# Patient Record
Sex: Female | Born: 1960 | Race: White | Hispanic: No | Smoking: Current every day smoker
Health system: Southern US, Community
[De-identification: ages and names within clinical notes are randomized; demographics above are authoritative.]

## PROBLEM LIST (undated history)

## (undated) DIAGNOSIS — F419 Anxiety disorder, unspecified: Secondary | ICD-10-CM

## (undated) DIAGNOSIS — F32A Depression, unspecified: Secondary | ICD-10-CM

## (undated) DIAGNOSIS — E079 Disorder of thyroid, unspecified: Secondary | ICD-10-CM

## (undated) DIAGNOSIS — F329 Major depressive disorder, single episode, unspecified: Secondary | ICD-10-CM

## (undated) DIAGNOSIS — I10 Essential (primary) hypertension: Secondary | ICD-10-CM

## (undated) DIAGNOSIS — M5430 Sciatica, unspecified side: Secondary | ICD-10-CM

## (undated) HISTORY — DX: Essential (primary) hypertension: I10

## (undated) HISTORY — PX: ABDOMINAL HYSTERECTOMY: SHX81

## (undated) HISTORY — DX: Anxiety disorder, unspecified: F41.9

---

## 2009-02-10 ENCOUNTER — Emergency Department (HOSPITAL_COMMUNITY): Admission: EM | Admit: 2009-02-10 | Discharge: 2009-02-10 | Payer: Self-pay | Admitting: Emergency Medicine

## 2009-03-29 ENCOUNTER — Ambulatory Visit (HOSPITAL_COMMUNITY): Admission: RE | Admit: 2009-03-29 | Discharge: 2009-03-29 | Payer: Self-pay | Admitting: Family Medicine

## 2010-04-04 ENCOUNTER — Ambulatory Visit (HOSPITAL_COMMUNITY): Admission: RE | Admit: 2010-04-04 | Discharge: 2010-04-04 | Payer: Self-pay | Admitting: Family Medicine

## 2010-08-27 IMAGING — CR DG FOOT COMPLETE 3+V*L*
3 series · 3 of 3 positions shown · non-contrast
Comparison: None

CLINICAL DATA: Left foot pain, swelling, fall

LEFT FOOT - COMPLETE 3+ VIEW

[view not recorded (1 of 3)]
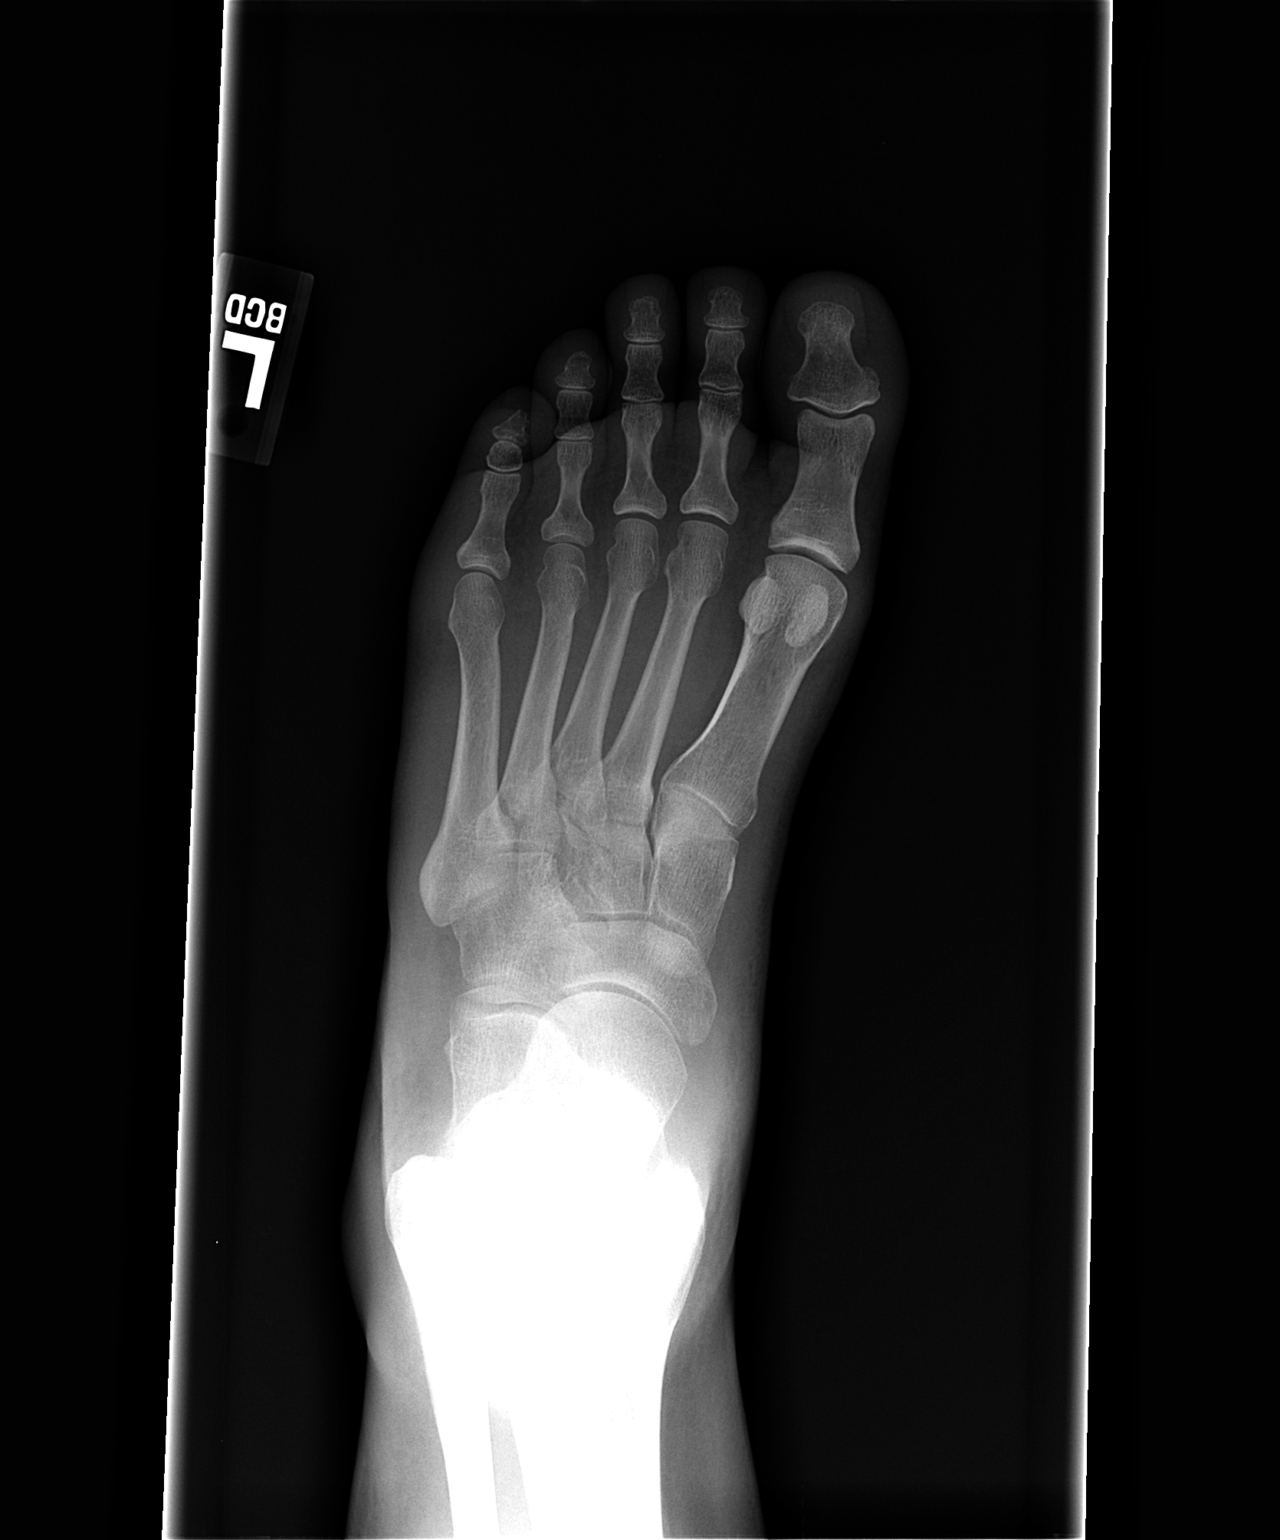

[view not recorded (2 of 3)]
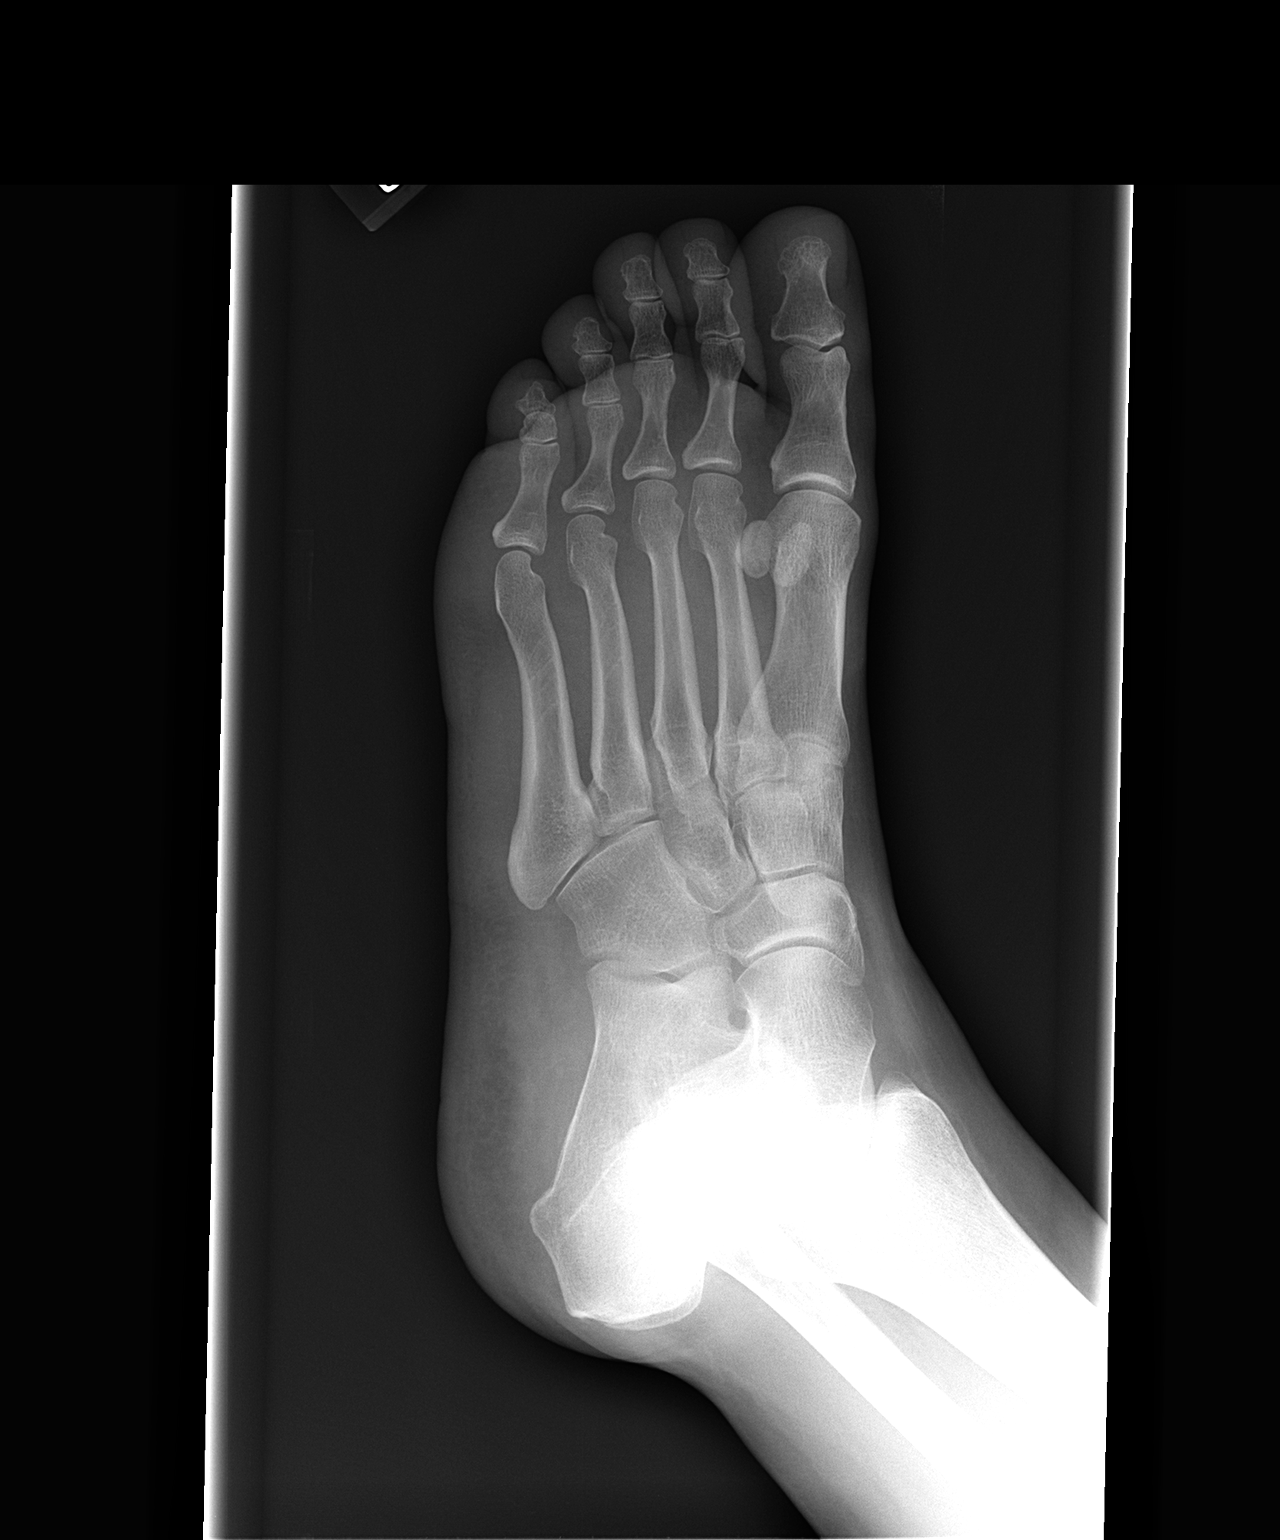

[view not recorded (3 of 3)]
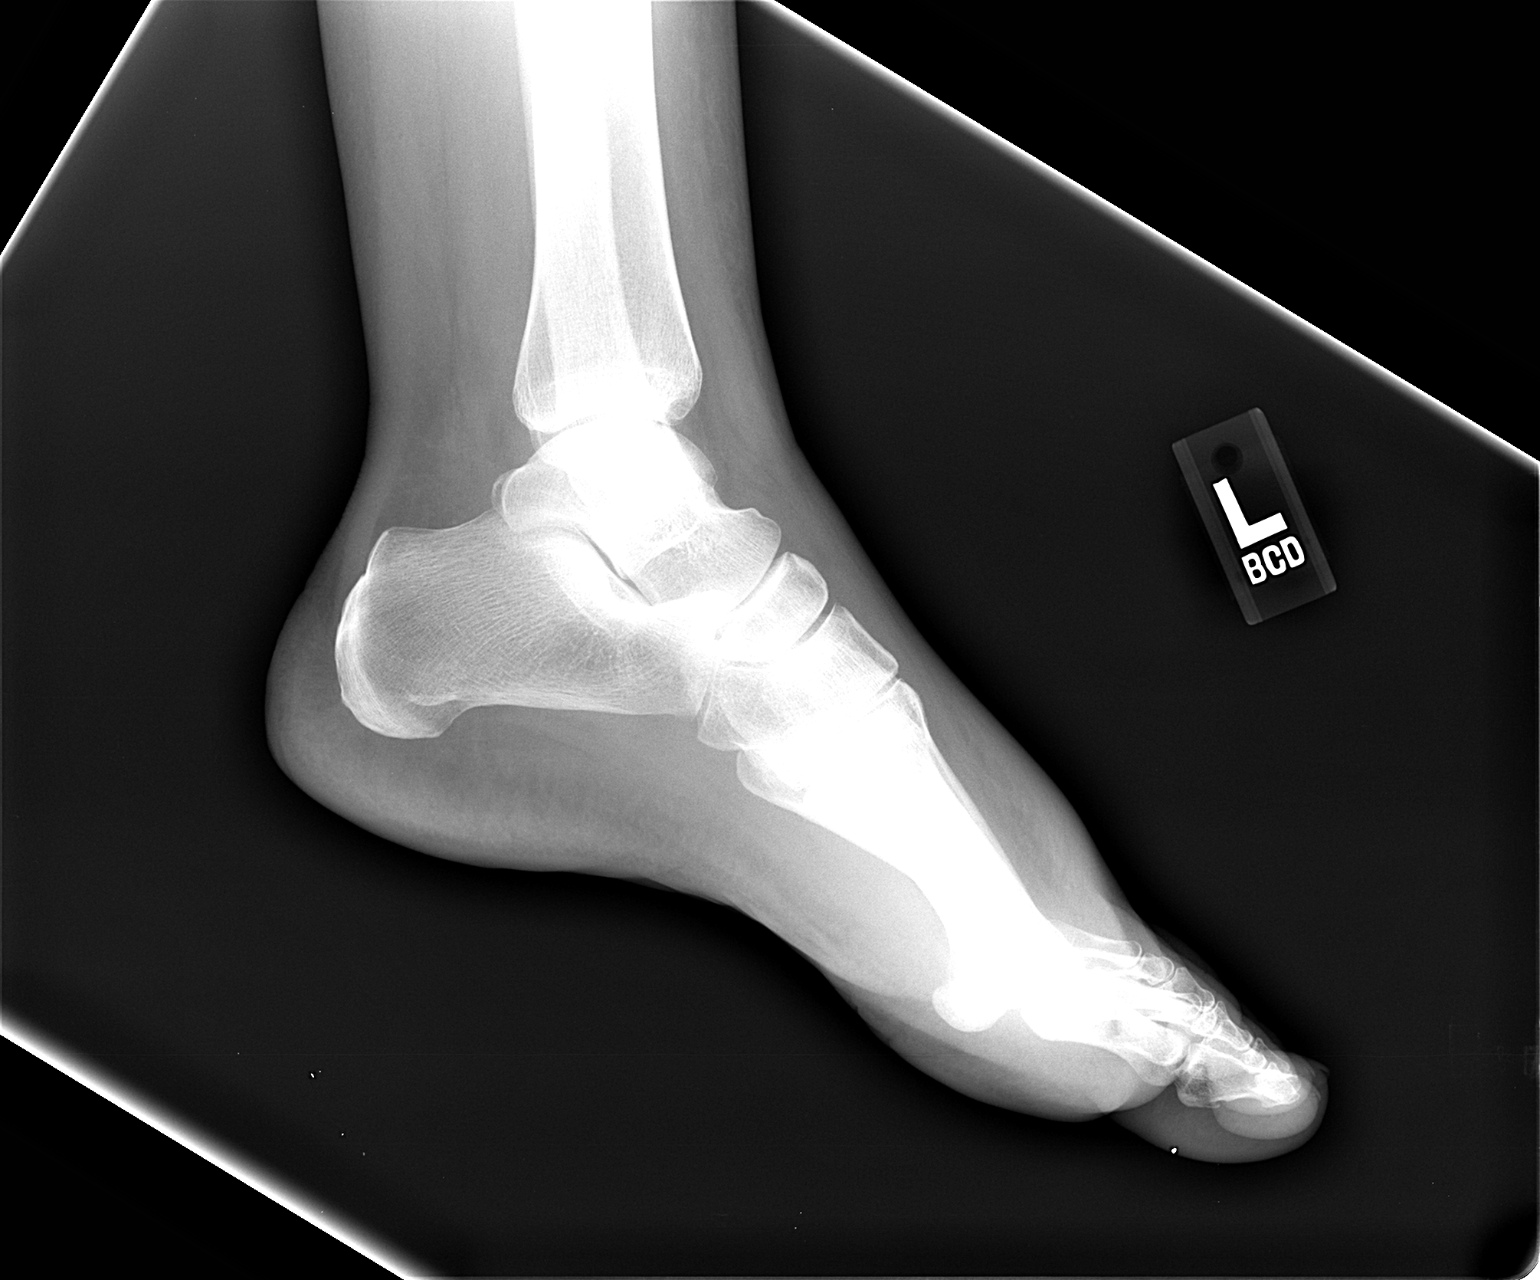

[3 of 3 positions shown; findings below may reference images not displayed]

FINDINGS: Dorsal soft tissue swelling distal foot.
Bone mineralization normal.
Joint spaces preserved.
No acute fracture, dislocation, or bone destruction.
IMPRESSION: No acute bony abnormalities.

## 2011-03-29 ENCOUNTER — Other Ambulatory Visit (HOSPITAL_COMMUNITY): Payer: Self-pay | Admitting: Family Medicine

## 2011-03-29 DIAGNOSIS — Z139 Encounter for screening, unspecified: Secondary | ICD-10-CM

## 2011-04-10 ENCOUNTER — Ambulatory Visit (HOSPITAL_COMMUNITY)
Admission: RE | Admit: 2011-04-10 | Discharge: 2011-04-10 | Disposition: A | Discharge status: Home / Self Care | Payer: PRIVATE HEALTH INSURANCE | Source: Ambulatory Visit | Attending: Family Medicine | Admitting: Family Medicine

## 2011-04-10 DIAGNOSIS — Z139 Encounter for screening, unspecified: Secondary | ICD-10-CM

## 2011-04-10 DIAGNOSIS — Z1231 Encounter for screening mammogram for malignant neoplasm of breast: Secondary | ICD-10-CM | POA: Insufficient documentation

## 2013-03-14 ENCOUNTER — Emergency Department (HOSPITAL_COMMUNITY)
Admission: EM | Admit: 2013-03-14 | Discharge: 2013-03-14 | Disposition: A | Discharge status: Home / Self Care | Payer: Disability Insurance | Attending: Emergency Medicine | Admitting: Emergency Medicine

## 2013-03-14 ENCOUNTER — Encounter (HOSPITAL_COMMUNITY): Payer: Self-pay | Admitting: Emergency Medicine

## 2013-03-14 DIAGNOSIS — M5430 Sciatica, unspecified side: Secondary | ICD-10-CM | POA: Insufficient documentation

## 2013-03-14 DIAGNOSIS — F17200 Nicotine dependence, unspecified, uncomplicated: Secondary | ICD-10-CM | POA: Insufficient documentation

## 2013-03-14 DIAGNOSIS — Z8659 Personal history of other mental and behavioral disorders: Secondary | ICD-10-CM | POA: Insufficient documentation

## 2013-03-14 DIAGNOSIS — M5432 Sciatica, left side: Secondary | ICD-10-CM

## 2013-03-14 DIAGNOSIS — Z8639 Personal history of other endocrine, nutritional and metabolic disease: Secondary | ICD-10-CM | POA: Insufficient documentation

## 2013-03-14 DIAGNOSIS — Z862 Personal history of diseases of the blood and blood-forming organs and certain disorders involving the immune mechanism: Secondary | ICD-10-CM | POA: Insufficient documentation

## 2013-03-14 HISTORY — DX: Depression, unspecified: F32.A

## 2013-03-14 HISTORY — DX: Disorder of thyroid, unspecified: E07.9

## 2013-03-14 HISTORY — DX: Major depressive disorder, single episode, unspecified: F32.9

## 2013-03-14 HISTORY — DX: Sciatica, unspecified side: M54.30

## 2013-03-14 MED ORDER — PROMETHAZINE HCL 12.5 MG PO TABS: 12.5000 mg | ORAL_TABLET | Freq: Four times a day (QID) | ORAL | Status: DC | PRN

## 2013-03-14 MED ORDER — NAPROXEN 500 MG PO TABS: 500.0000 mg | ORAL_TABLET | Freq: Two times a day (BID) | ORAL | Status: DC

## 2013-03-14 MED ORDER — CYCLOBENZAPRINE HCL 10 MG PO TABS: 10.0000 mg | ORAL_TABLET | Freq: Two times a day (BID) | ORAL | Status: DC | PRN

## 2013-03-14 NOTE — ED Provider Notes (Signed)
CSN: [id number]     Arrival date & time 03/14/13  1308 History   First MD Initiated Contact with Patient 03/14/13 1340     Chief Complaint  Patient presents with  . Back Pain   (Consider location/radiation/quality/duration/timing/severity/associated sxs/prior Treatment) Patient is a 52 y.o. female presenting with back pain. The history is provided by the patient.  Back Pain Location:  Lumbar spine Radiates to:  L posterior upper leg Pain severity:  Severe Pain is:  Same all the time Onset quality:  Gradual Duration:  3 days Timing:  Constant Progression:  Worsening Chronicity:  Chronic Relieved by:  Nothing Worsened by:  Movement, twisting, standing and bending Ineffective treatments:  Ibuprofen, cold packs and heating pad Associated symptoms: no bladder incontinence, no bowel incontinence, no chest pain, no dysuria, no fever, no numbness and no weakness   Risk factors: menopause   Risk factors: no hx of cancer, no hx of osteoporosis and no steroid use    Sarah Mercer is a 52 y.o. female who presents to the ED with low back pain that started 3 days ago. She has been diagnosed in the past with DDD, sciatica and bulging disc. That was years ago and has had problems off and on but just takes ibuprofen and alternates heat and ice until it goes away. This time the pain will not go away.   Past Medical History  Diagnosis Date  . Sciatica   . Thyroid disease   . Depression    Past Surgical History  Procedure Laterality Date  . Abdominal hysterectomy     No family history on file. History  Substance Use Topics  . Smoking status: Current Every Day Smoker    Types: Cigarettes  . Smokeless tobacco: Not on file  . Alcohol Use: Yes     Comment: Occ   OB History   Grav Para Term Preterm Abortions TAB SAB Ect Mult Living                 Review of Systems  Constitutional: Negative for fever.  Respiratory: Negative for shortness of breath.   Cardiovascular: Negative for chest  pain.  Gastrointestinal: Negative for bowel incontinence.  Genitourinary: Negative for bladder incontinence, dysuria, urgency and frequency.  Musculoskeletal: Positive for back pain.  Skin: Negative for wound.  Neurological: Negative for weakness and numbness.  Psychiatric/Behavioral: The patient is not nervous/anxious.     Allergies  Review of patient's allergies indicates no known allergies.  Home Medications  No current outpatient prescriptions on file. BP 130/71  Pulse 73  Temp(Src) 98.1 F (36.7 C)  Resp 18  Ht 5\' 2"  (1.575 m)  Wt 148 lb (67.132 kg)  BMI 27.06 kg/m2  SpO2 99% Physical Exam  Nursing note and vitals reviewed. Constitutional: She is oriented to person, place, and time. She appears well-developed and well-nourished. No distress.  HENT:  Head: Normocephalic and atraumatic.  Eyes: Conjunctivae and EOM are normal.  Neck: Neck supple.  Cardiovascular: Normal rate, regular rhythm and normal heart sounds.   Pulmonary/Chest: Effort normal.  Abdominal: Soft. There is no tenderness.  Musculoskeletal:       Lumbar back: She exhibits decreased range of motion, tenderness and spasm. She exhibits no deformity.       Back:  Pedal pulses equal bilateral, adequate circulation, good touch sensation. Pain with straight leg raises on the left.   Neurological: She is alert and oriented to person, place, and time. She has normal strength and normal reflexes. No  cranial nerve deficit or sensory deficit. Coordination and gait normal.  Skin: Skin is warm and dry.  Psychiatric: She has a normal mood and affect. Her behavior is normal.    ED Course  Procedures   MDM  52 y.o. female with sciatica left. Will treat pain and she will follow up with her PCP. She is stable for discharge and remains neurovascularly intact.  Discussed with the patient and all questioned fully answered. She will returnif any problems arise.    Medication List    TAKE these medications        cyclobenzaprine 10 MG tablet  Commonly known as:  FLEXERIL  Take 1 tablet (10 mg total) by mouth 2 (two) times daily as needed for muscle spasms.     naproxen 500 MG tablet  Commonly known as:  NAPROSYN  Take 1 tablet (500 mg total) by mouth 2 (two) times daily.     promethazine 12.5 MG tablet  Commonly known as:  PHENERGAN  Take 1 tablet (12.5 mg total) by mouth every 6 (six) hours as needed for nausea.      ASK your doctor about these medications       levothyroxine 75 MCG tablet  Commonly known as:  SYNTHROID, LEVOTHROID  Take 75 mcg by mouth daily before breakfast.           Janne Napoleon, NP 03/14/13 1828

## 2013-03-14 NOTE — ED Notes (Signed)
Lower back pain with pain radiating down left leg. Pt states hx of sciatica but states pain became severe Wednesday night.

## 2013-03-15 NOTE — ED Provider Notes (Signed)
Medical screening examination/treatment/procedure(s) were performed by non-physician practitioner and as supervising physician I was immediately available for consultation/collaboration.   Dagmar Hait, MD 03/15/13 [phone]

## 2013-05-19 ENCOUNTER — Other Ambulatory Visit (HOSPITAL_COMMUNITY): Payer: Self-pay | Admitting: *Deleted

## 2013-05-19 DIAGNOSIS — Z139 Encounter for screening, unspecified: Secondary | ICD-10-CM

## 2013-05-25 ENCOUNTER — Ambulatory Visit (HOSPITAL_COMMUNITY)
Admission: RE | Admit: 2013-05-25 | Discharge: 2013-05-25 | Disposition: A | Discharge status: Home / Self Care | Payer: PRIVATE HEALTH INSURANCE | Source: Ambulatory Visit | Attending: *Deleted | Admitting: *Deleted

## 2013-05-25 DIAGNOSIS — Z139 Encounter for screening, unspecified: Secondary | ICD-10-CM

## 2013-05-25 DIAGNOSIS — Z1231 Encounter for screening mammogram for malignant neoplasm of breast: Secondary | ICD-10-CM | POA: Insufficient documentation

## 2013-05-26 ENCOUNTER — Other Ambulatory Visit (HOSPITAL_COMMUNITY): Payer: Self-pay | Admitting: *Deleted

## 2013-05-26 DIAGNOSIS — IMO0002 Reserved for concepts with insufficient information to code with codable children: Secondary | ICD-10-CM

## 2013-06-10 ENCOUNTER — Ambulatory Visit (HOSPITAL_COMMUNITY)
Admission: RE | Admit: 2013-06-10 | Discharge: 2013-06-10 | Disposition: A | Discharge status: Home / Self Care | Payer: PRIVATE HEALTH INSURANCE | Source: Ambulatory Visit | Attending: *Deleted | Admitting: *Deleted

## 2013-06-10 ENCOUNTER — Other Ambulatory Visit (HOSPITAL_COMMUNITY): Payer: Self-pay | Admitting: *Deleted

## 2013-06-10 DIAGNOSIS — R229 Localized swelling, mass and lump, unspecified: Secondary | ICD-10-CM

## 2013-06-10 DIAGNOSIS — IMO0002 Reserved for concepts with insufficient information to code with codable children: Secondary | ICD-10-CM

## 2013-06-10 DIAGNOSIS — N6009 Solitary cyst of unspecified breast: Secondary | ICD-10-CM | POA: Insufficient documentation

## 2013-06-10 DIAGNOSIS — N6320 Unspecified lump in the left breast, unspecified quadrant: Secondary | ICD-10-CM

## 2013-06-10 DIAGNOSIS — N63 Unspecified lump in unspecified breast: Secondary | ICD-10-CM | POA: Insufficient documentation

## 2013-06-10 DIAGNOSIS — R928 Other abnormal and inconclusive findings on diagnostic imaging of breast: Secondary | ICD-10-CM | POA: Insufficient documentation

## 2013-06-15 ENCOUNTER — Other Ambulatory Visit (HOSPITAL_COMMUNITY): Payer: Self-pay | Admitting: Nurse Practitioner

## 2013-06-15 DIAGNOSIS — N6320 Unspecified lump in the left breast, unspecified quadrant: Secondary | ICD-10-CM

## 2013-06-17 ENCOUNTER — Other Ambulatory Visit (HOSPITAL_COMMUNITY): Payer: Self-pay | Admitting: Nurse Practitioner

## 2013-06-17 ENCOUNTER — Encounter (HOSPITAL_COMMUNITY): Payer: Self-pay

## 2013-06-17 ENCOUNTER — Ambulatory Visit (HOSPITAL_COMMUNITY)
Admission: RE | Admit: 2013-06-17 | Discharge: 2013-06-17 | Disposition: A | Discharge status: Home / Self Care | Payer: PRIVATE HEALTH INSURANCE | Source: Ambulatory Visit | Attending: *Deleted | Admitting: *Deleted

## 2013-06-17 ENCOUNTER — Other Ambulatory Visit (HOSPITAL_COMMUNITY): Payer: Self-pay

## 2013-06-17 DIAGNOSIS — N6320 Unspecified lump in the left breast, unspecified quadrant: Secondary | ICD-10-CM

## 2013-06-17 DIAGNOSIS — N6009 Solitary cyst of unspecified breast: Secondary | ICD-10-CM | POA: Insufficient documentation

## 2013-06-17 MED ORDER — LIDOCAINE HCL (PF) 2 % IJ SOLN
10.0000 mL | Freq: Once | INTRAMUSCULAR | Status: AC
  Administered 2013-06-17: 10 mL

## 2013-06-17 MED ORDER — LIDOCAINE HCL (PF) 2 % IJ SOLN
INTRAMUSCULAR | Status: AC
  Administered 2013-06-17: 10 mL
  Filled 2013-06-17: qty 10

## 2013-06-17 NOTE — Discharge Instructions (Addendum)
Breast Cyst  A breast cyst is a sac in the breast that is filled with fluid. Breast cysts are common in women. Women can have one or many cysts. When the breasts contain many cysts, it is usually due to a noncancerous (benign) condition called fibrocystic change. These lumps form under the influence of female hormones (estrogen and progesterone). The lumps are most often located in the upper, outer portion of the breast. They are often more swollen, painful, and tender before your period starts. They usually disappear after menopause, unless you are on hormone therapy.   There are several types of cysts:  · Macrocyst. This is a cyst that is about 2 in. (5.1 cm) in diameter.    · Microcyst. This is a tiny cyst that you cannot feel but can be seen with a mammogram or an ultrasound.    · Galactocele. This is a cyst containing milk that may develop if you suddenly stop breastfeeding.    · Sebaceous cyst of the skin. This type of cyst is not in the breast tissue itself.  Breast cysts do not increase your risk of breast cancer. However, they must be monitored closely because they can be cancerous.   CAUSES   It is not known exactly what causes a breast cyst to form. Possible causes include:   · An overgrowth of milk glands and connective tissue in the breast can block the milk glands, causing them to fill with fluid.    · Scar tissue in the breast from previous surgery may block the glands, causing a cyst.    RISK FACTORS  Estrogen may influence the development of a breast cyst.    SIGNS AND SYMPTOMS   · Feeling a smooth, round, soft lump (like a grape) in the breast that is easily moveable.    · Breast discomfort or pain.  · Increase in size of the lump before your menstrual period and decrease in its size after your menstrual period.    DIAGNOSIS   A cyst can be felt during a physical exam by your health care provider. A breast X-ray exam (mammogram) and ultrasonography will be done to confirm the diagnosis. Fluid may  be removed from the cyst with a needle (fine needle aspiration) to make sure the cyst is not cancerous.    TREATMENT   Treatment may not be necessary. Your health care provider may monitor the cyst to see if it goes away on its own. If treatment is needed, it may include:  · Hormone treatment.    · Needle aspiration. There is a chance of the cyst coming back after aspiration.    · Surgery to remove the whole cyst.    HOME CARE INSTRUCTIONS   · Keep all follow-up appointments with your health care provider.  · See your health care provider regularly:  · Get a yearly exam by your health care provider.  · Have a clinical breast exam by a health care provider every 1 3 years if you are 20 54 years of age. After age 40 years, you should have the exam every year.    · Get mammogram tests as directed by your health care provider.    · Understand the normal appearance and feel of your breasts and perform breast self-exams.    · Only take over-the-counter or prescription medicines as directed by your health care provider.    · Wear a supportive bra, especially when exercising.    · Avoid caffeine.    · Reduce your salt intake, especially before your menstrual period. Too much salt can cause fluid retention, breast   swelling, and discomfort.    SEEK MEDICAL CARE IF:   · You feel, or think you feel, a lump in your breast.    · You notice that both breasts look or feel different than usual.    · Your breast is still causing pain after your menstrual period is over.    · You need medicine for breast pain and swelling that occurs with your menstrual period.    SEEK IMMEDIATE MEDICAL CARE IF:   · You have severe pain, tenderness, redness, or warmth in your breast.    · You have nipple discharge or bleeding.    · Your breast lump becomes hard and painful.    · You find new lumps or bumps that were not there before.    · You feel lumps in your armpit (axilla).    · You notice dimpling or wrinkling of the breast or nipple.    · You  have a fever.    MAKE SURE YOU:  · Understand these instructions.  · Will watch your condition.  · Will get help right away if you are not doing well or get worse.  Document Released: 05/21/2005 Document Revised: 01/21/2013 Document Reviewed: 12/18/2012  [employer]® Patient Information ©2014 [employer].

## 2013-06-17 NOTE — Progress Notes (Signed)
Procedure complete no signs of distress.  

## 2013-09-17 ENCOUNTER — Other Ambulatory Visit (HOSPITAL_COMMUNITY): Payer: Self-pay | Admitting: Family Medicine

## 2013-09-17 ENCOUNTER — Ambulatory Visit (HOSPITAL_COMMUNITY)
Admission: RE | Admit: 2013-09-17 | Discharge: 2013-09-17 | Disposition: A | Discharge status: Home / Self Care | Payer: Disability Insurance | Source: Ambulatory Visit | Attending: Family Medicine | Admitting: Family Medicine

## 2013-09-17 DIAGNOSIS — M51379 Other intervertebral disc degeneration, lumbosacral region without mention of lumbar back pain or lower extremity pain: Secondary | ICD-10-CM | POA: Insufficient documentation

## 2013-09-17 DIAGNOSIS — M5430 Sciatica, unspecified side: Secondary | ICD-10-CM

## 2013-09-17 DIAGNOSIS — M5137 Other intervertebral disc degeneration, lumbosacral region: Secondary | ICD-10-CM | POA: Insufficient documentation

## 2014-02-09 DIAGNOSIS — E039 Hypothyroidism, unspecified: Secondary | ICD-10-CM | POA: Insufficient documentation

## 2014-12-25 IMAGING — US US BREAST LTD UNI LEFT INC AXILLA
1 series · 12 of 12 positions shown · non-contrast
Comparison: Previous examinations.

ADDENDUM:
The patient has been scheduled for left breast ultrasound
aspiration/ core biopsy on [REDACTED] 06/17/2013 at 11 a.m..
CLINICAL DATA: Recall from screening mammography.

EXAM:
DIGITAL DIAGNOSTIC  left breast MAMMOGRAM
ULTRASOUND left BREAST

[Series 1: us breast ltd uni left inc axilla · 0.06mm/px · 12 of 12 slices shown]
[im 1/12]
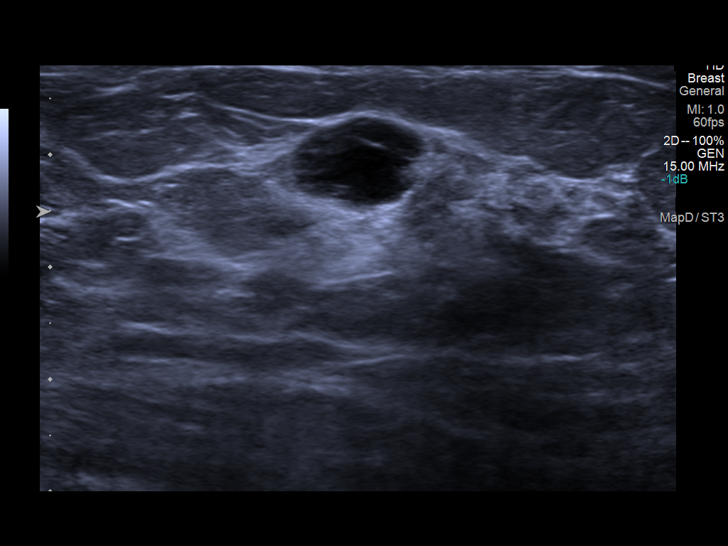
[im 2/12]
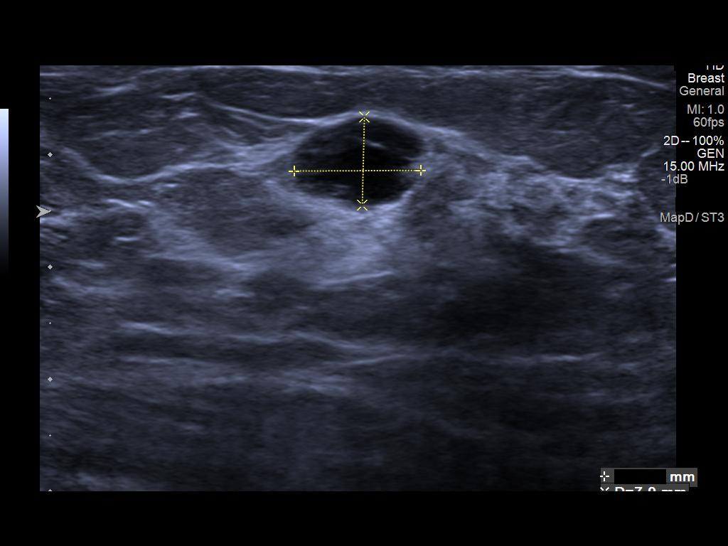
[im 3/12]
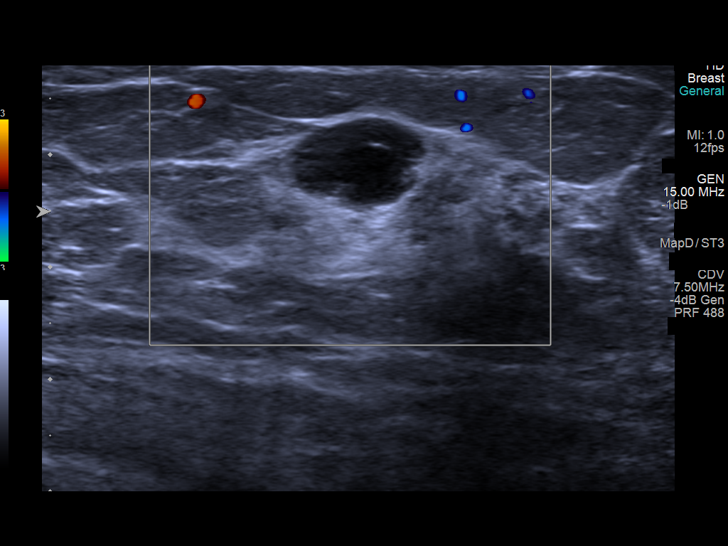
[im 4/12]
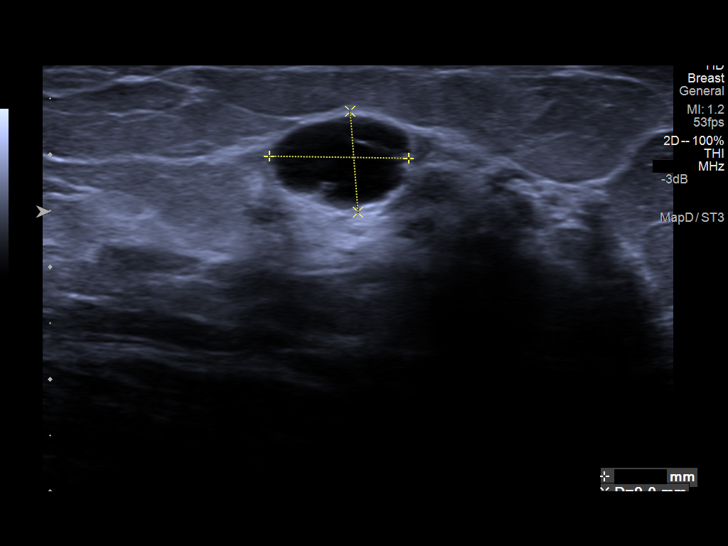
[im 5/12]
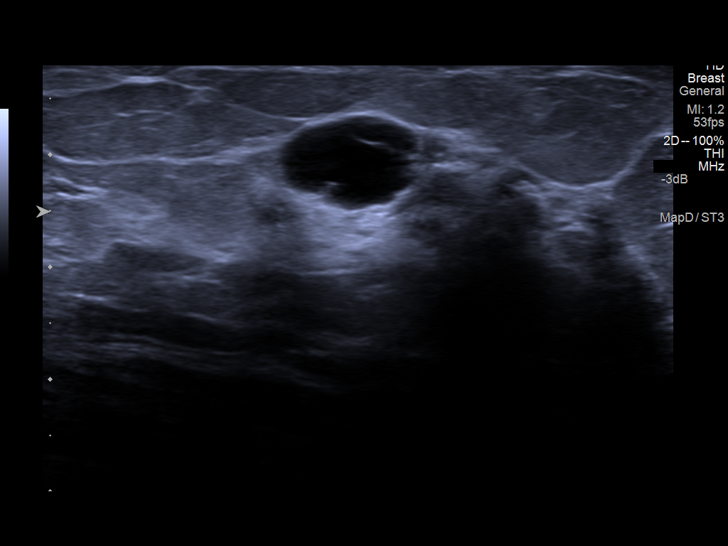
[im 6/12]
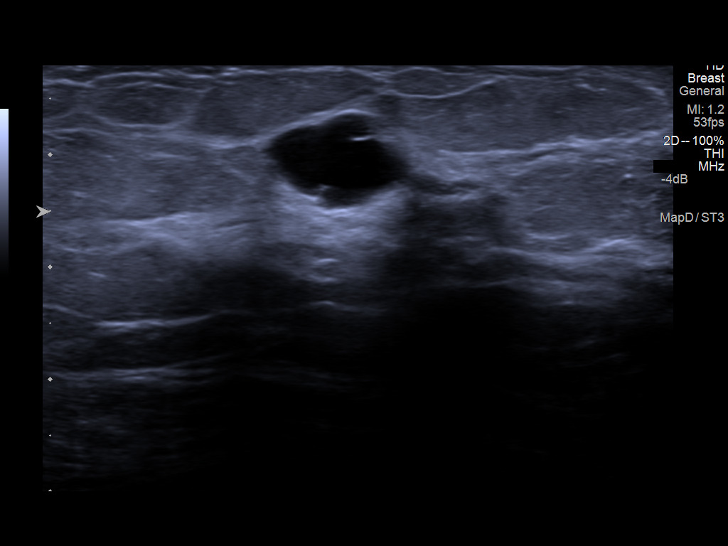
[im 7/12]
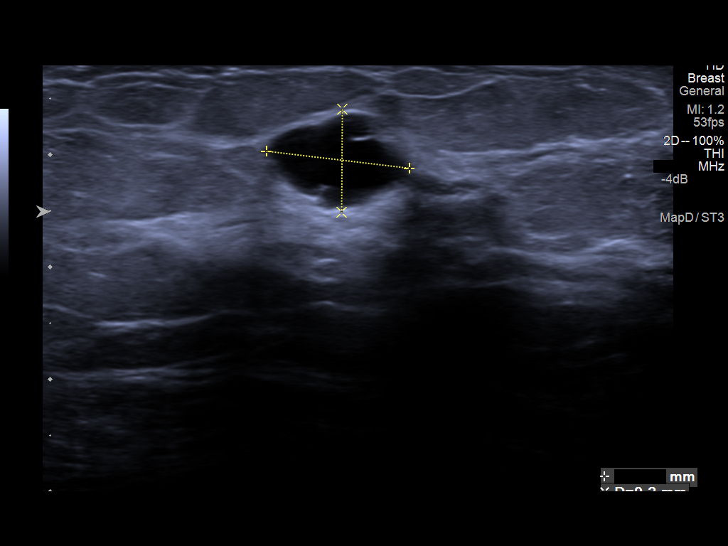
[im 8/12]
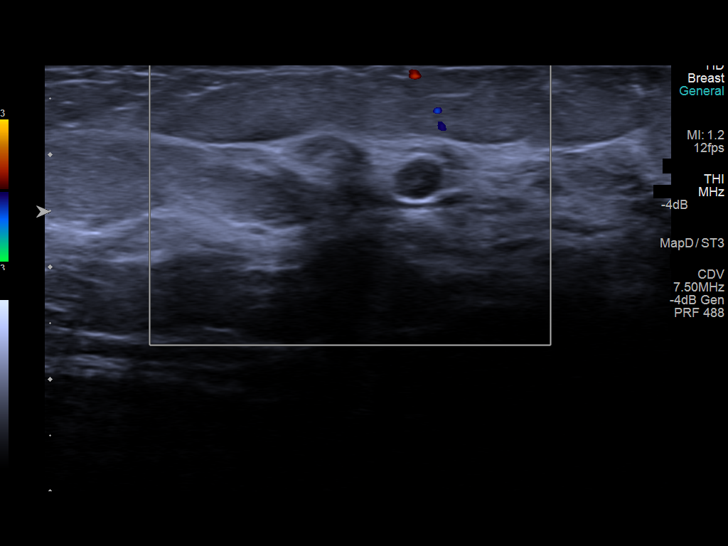
[im 9/12]
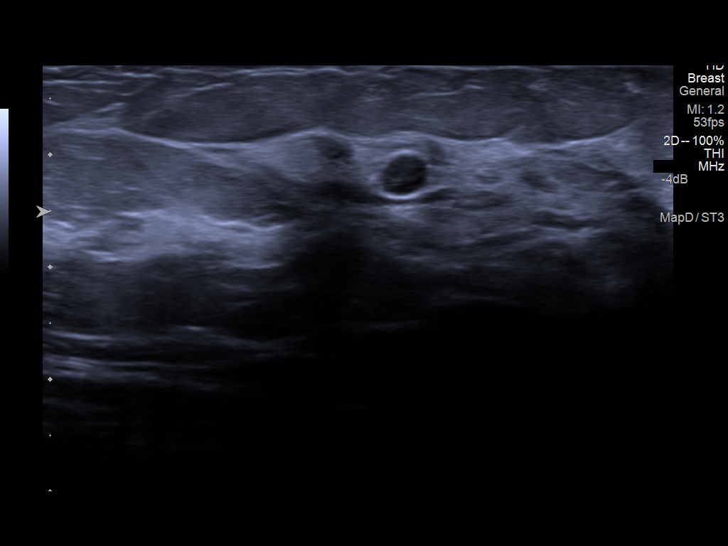
[im 10/12]
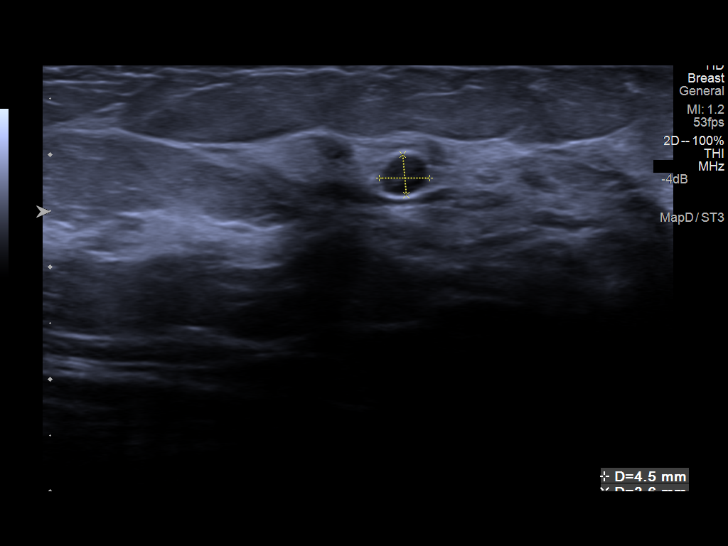
[im 11/12]
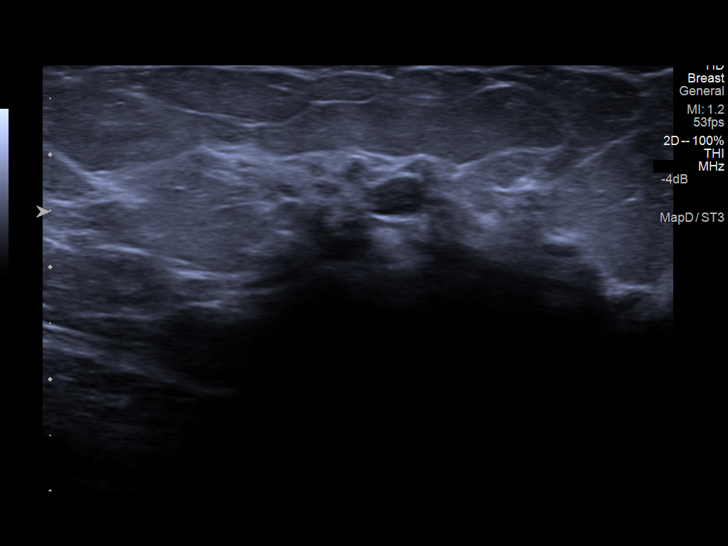
[im 12/12]
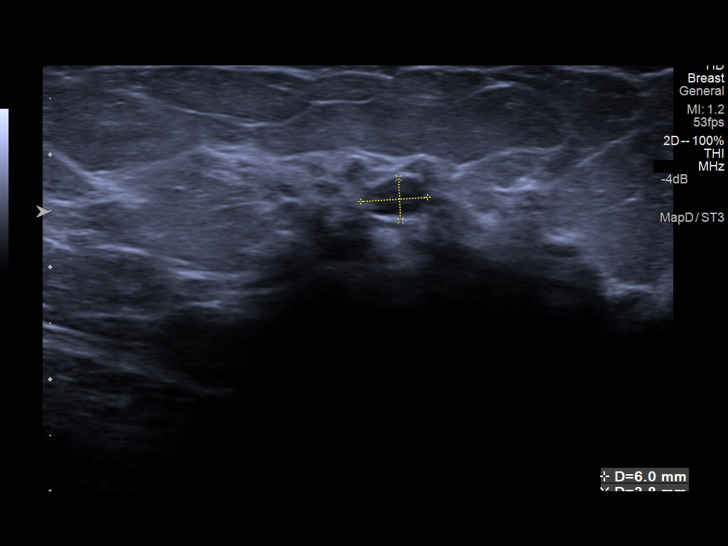

[12 of 12 positions shown; findings below may reference images not displayed]

ACR Breast Density Category c: The breast tissue is heterogeneously
dense, which may obscure small masses.
FINDINGS: Spot compression views of the left breast demonstrate a partially
obscured, oval mass be present superiorly located within the left
breast at 12 o'clock position.

On physical exam, there is no discrete palpable abnormality.

Ultrasound is performed, showing a cyst with thin septations located
within the left breast at 12 o'clock position 3 cm from the nipple
measuring 1.3 x 1.2 x 0.9 cm in size. There is no mural nodularity
or wall thickening associated with this cyst. Adjacent to this
larger cyst is an oval, circumscribed, hypoechoic mass with internal
echoes and increased through sound transmission measuring 6 x 5 x 4
mm in size. Most likely this represents a complicated cyst with low
level internal echoes. However, a solid mass could also give this
appearance and ultrasound-guided aspiration is recommended (if this
does not aspirate and fully collapse, I recommend ultrasound-guided
core biopsy). There are no additional findings.
IMPRESSION: 1. The abnormality noted on mammography corresponds to a 1.3 cm in
size benign appearing cyst located at the 12 o'clock position 3 cm
from the nipple. 2. Adjacent to this cyst is a 6 mm circumscribed
hypoechoic mass versus complicated cyst. Ultrasound-guided
aspiration is recommended. If this does not collapse, recommend
ultrasound-guided core biopsy.

RECOMMENDATION:
Left breast ultrasound-guided aspiration/core biopsy.

I have discussed the findings and recommendations with the patient.
Results were also provided in writing at the conclusion of the
visit. If applicable, a reminder letter will be sent to the patient
regarding the next appointment.

BI-RADS CATEGORY  4: Suspicious abnormality - biopsy should be
considered.

## 2015-01-01 IMAGING — US US BREAST BX W LOC DEV 1ST LESION IMG BX SPEC US GUIDE*L*
1 series · 4 of 4 positions shown · non-contrast
Comparison: none

[Series 1: us breast bx w loc dev 1st lesion img bx spec us g · 0.06mm/px · 4 of 4 slices shown]
[im 1/4]
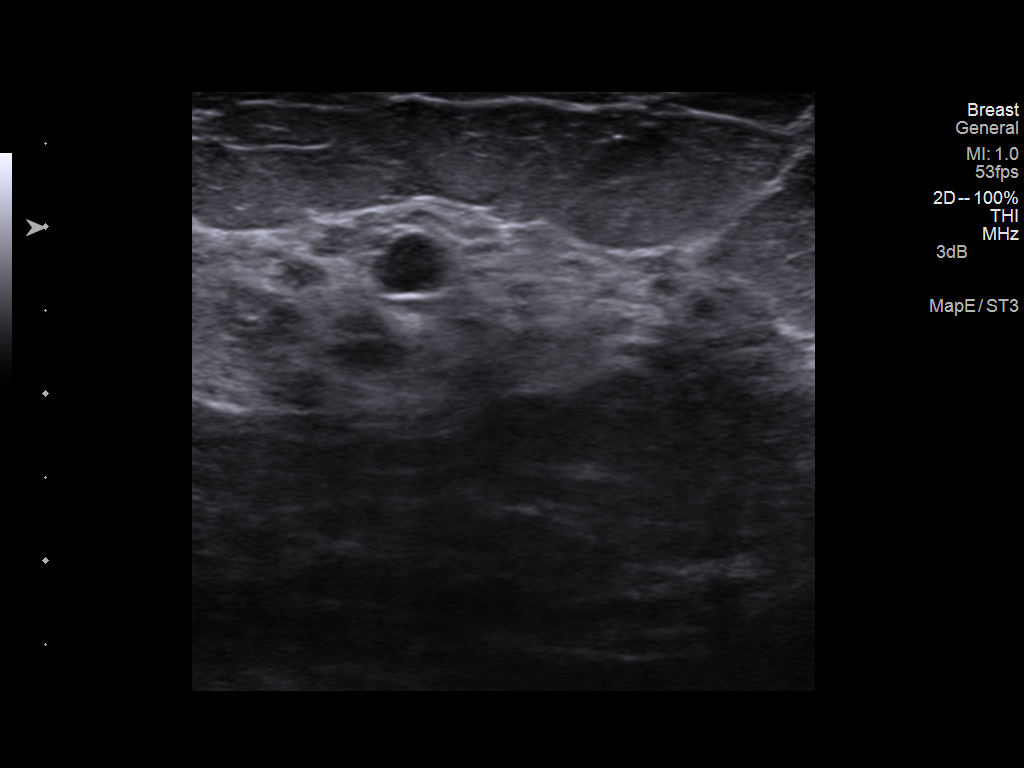
[im 2/4]
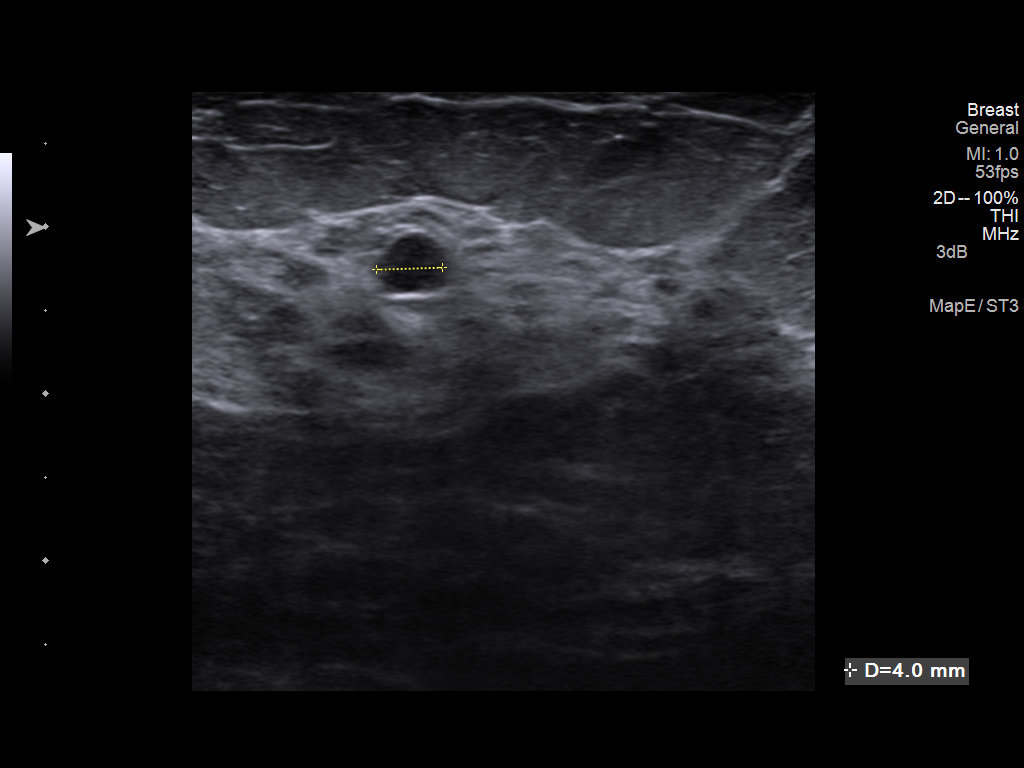
[im 3/4]
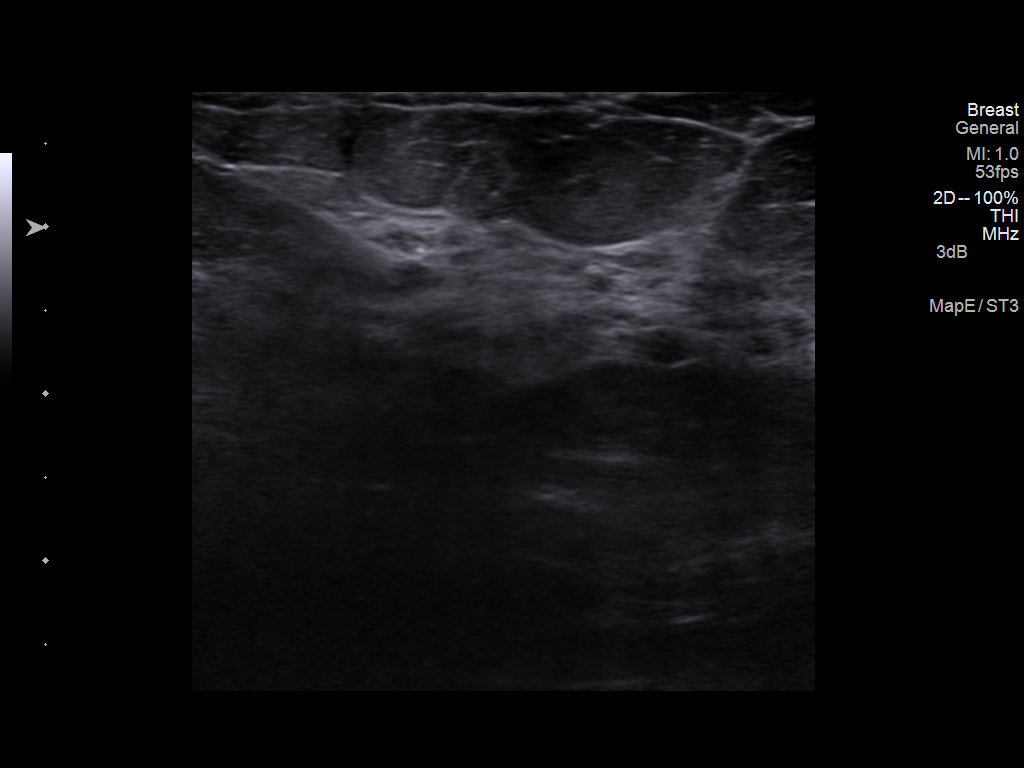
[im 4/4]
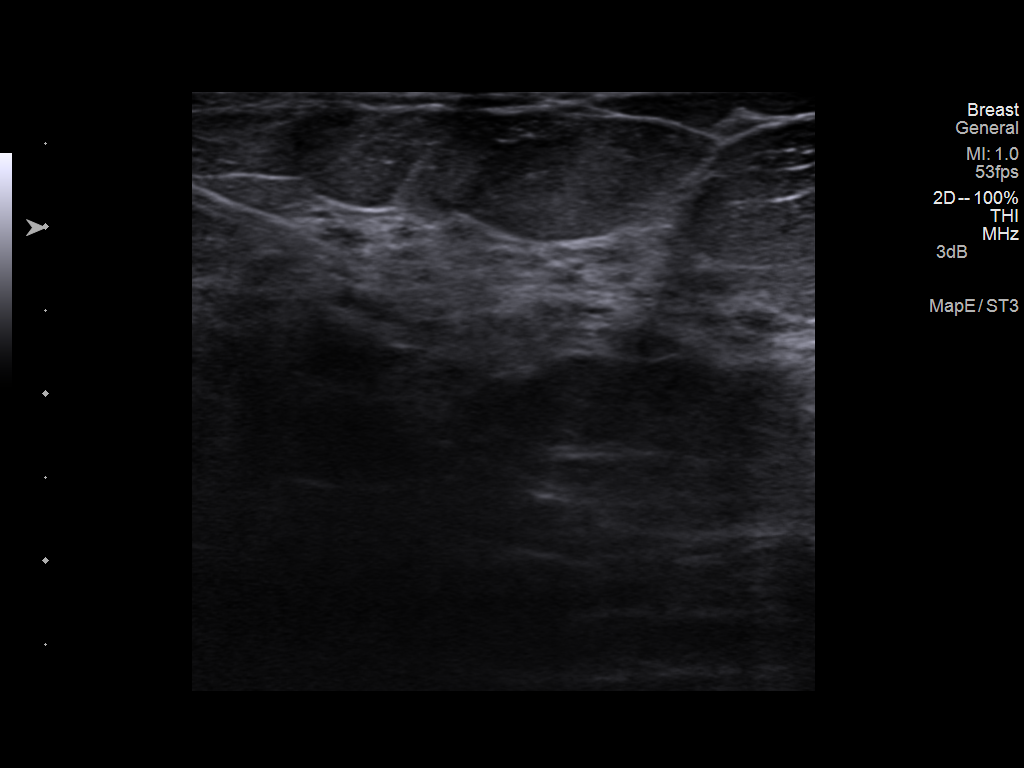

[4 of 4 positions shown; findings below may reference images not displayed]

Canned report from images found in remote index.

Refer to host system for actual result text.

## 2015-05-14 ENCOUNTER — Encounter (HOSPITAL_COMMUNITY): Payer: Self-pay | Admitting: Emergency Medicine

## 2015-05-14 ENCOUNTER — Emergency Department (HOSPITAL_COMMUNITY)
Admission: EM | Admit: 2015-05-14 | Discharge: 2015-05-14 | Disposition: A | Discharge status: Home / Self Care | Payer: PRIVATE HEALTH INSURANCE | Attending: Emergency Medicine | Admitting: Emergency Medicine

## 2015-05-14 DIAGNOSIS — Y998 Other external cause status: Secondary | ICD-10-CM | POA: Insufficient documentation

## 2015-05-14 DIAGNOSIS — Z79899 Other long term (current) drug therapy: Secondary | ICD-10-CM | POA: Insufficient documentation

## 2015-05-14 DIAGNOSIS — F17210 Nicotine dependence, cigarettes, uncomplicated: Secondary | ICD-10-CM | POA: Insufficient documentation

## 2015-05-14 DIAGNOSIS — M5432 Sciatica, left side: Secondary | ICD-10-CM | POA: Insufficient documentation

## 2015-05-14 DIAGNOSIS — Y9289 Other specified places as the place of occurrence of the external cause: Secondary | ICD-10-CM | POA: Insufficient documentation

## 2015-05-14 DIAGNOSIS — S0990XA Unspecified injury of head, initial encounter: Secondary | ICD-10-CM | POA: Diagnosis present

## 2015-05-14 DIAGNOSIS — E039 Hypothyroidism, unspecified: Secondary | ICD-10-CM | POA: Diagnosis not present

## 2015-05-14 DIAGNOSIS — F329 Major depressive disorder, single episode, unspecified: Secondary | ICD-10-CM | POA: Diagnosis not present

## 2015-05-14 DIAGNOSIS — W19XXXA Unspecified fall, initial encounter: Secondary | ICD-10-CM

## 2015-05-14 DIAGNOSIS — Y9321 Activity, ice skating: Secondary | ICD-10-CM | POA: Insufficient documentation

## 2015-05-14 DIAGNOSIS — S0003XA Contusion of scalp, initial encounter: Secondary | ICD-10-CM | POA: Diagnosis not present

## 2015-05-14 MED ORDER — OXYCODONE-ACETAMINOPHEN 5-325 MG PO TABS: 1.0000 | ORAL_TABLET | Freq: Three times a day (TID) | ORAL | Status: DC | PRN

## 2015-05-14 MED ORDER — ONDANSETRON 4 MG PO TBDP
4.0000 mg | ORAL_TABLET | Freq: Once | ORAL | Status: AC
  Administered 2015-05-14: 4 mg via ORAL
  Filled 2015-05-14: qty 1

## 2015-05-14 MED ORDER — ONDANSETRON HCL 4 MG PO TABS: 4.0000 mg | ORAL_TABLET | Freq: Three times a day (TID) | ORAL | Status: DC | PRN

## 2015-05-14 MED ORDER — OXYCODONE-ACETAMINOPHEN 5-325 MG PO TABS
1.0000 | ORAL_TABLET | Freq: Once | ORAL | Status: AC
Start: 2015-05-14 — End: 2015-05-14
  Administered 2015-05-14: 1 via ORAL
  Filled 2015-05-14: qty 1

## 2015-05-14 NOTE — ED Notes (Signed)
PT states she was ice skating with her grandson and fell backwards on the ice and hit the back of her head. PT has raised area to back of head and states lower back pain radiating down left leg. PT ambulatory in triage.

## 2015-05-14 NOTE — ED Provider Notes (Addendum)
CSN: [id number]     Arrival date & time 05/14/15  1744 History   First MD Initiated Contact with Patient 05/14/15 1806     Chief Complaint  Patient presents with  . Fall     (Consider location/radiation/quality/duration/timing/severity/associated sxs/prior Treatment) HPI 54 year old female with history of sciatica who presents after fall. States that she was ice skating with her grandson, when her iceskates hit a rock causing her to fall backwards. She landed on her buttocks and then subsequently on her head. She did not have loss of consciousness, but did initially feel dizzy and nauseous. The symptoms initially went away, but states that she has a bump to the back of her head where she is having pain. Denies any vision or speech changes, vomiting, neck pain or back pain, difficulty breathing or chest pain, abdominal pain, numbness or weakness. States that she landed on her sciatic nerve, and has had a worsening pain shooting down the left leg since her injury. Does not take any anticoagulation. Past Medical History  Diagnosis Date  . Sciatica   . Thyroid disease   . Depression    Past Surgical History  Procedure Laterality Date  . Abdominal hysterectomy     History reviewed. No pertinent family history. Social History  Substance Use Topics  . Smoking status: Current Every Day Smoker -- 0.50 packs/day    Types: Cigarettes  . Smokeless tobacco: None  . Alcohol Use: Yes     Comment: Occ   OB History    No data available     Review of Systems 10/14 systems reviewed and are negative other than those stated in the HPI   Allergies  Review of patient's allergies indicates no known allergies.  Home Medications   Prior to Admission medications   Medication Sig Start Date End Date Taking? Authorizing Provider  ALPRAZolam Prudy Feeler) 1 MG tablet Take 1 mg by mouth at bedtime. 04/14/15  Yes Historical Provider, MD  buPROPion (WELLBUTRIN XL) 300 MG 24 hr tablet Take 300 mg by mouth  every morning. 04/27/15  Yes Historical Provider, MD  levothyroxine (SYNTHROID, LEVOTHROID) 88 MCG tablet Take 88 mcg by mouth daily. 04/29/15  Yes Historical Provider, MD  venlafaxine XR (EFFEXOR-XR) 75 MG 24 hr capsule Take 225 mg by mouth every morning. 04/27/15  Yes Historical Provider, MD  ondansetron (ZOFRAN) 4 MG tablet Take 1 tablet (4 mg total) by mouth every 8 (eight) hours as needed for nausea or vomiting. 05/14/15   Lavera Guise, MD   BP 145/79 mmHg  Pulse 72  Temp(Src) 97.8 F (36.6 C) (Oral)  Resp 18  Ht  (1.549 m)  Wt 145 lb (65.772 kg)  BMI 27.41 kg/m2  SpO2 100% Physical Exam Physical Exam  Nursing note and vitals reviewed. Constitutional: Well developed, well nourished, non-toxic, and in no acute distress Head: Normocephalic. Small hematoma noted over the scalp of the occipital skull. Mouth/Throat: Oropharynx is clear and moist.  Neck: Normal range of motion. Neck supple.  no cervical spine tenderness Cardiovascular: Normal rate and regular rhythm.   Pulmonary/Chest: Effort normal and breath sounds normal.  no chest wall tenderness. Abdominal: Soft. There is no tenderness. There is no rebound and no guarding.  Musculoskeletal: No deformities. Normal range of motion of all 4 extremities. No TLS spine tenderness or step-offs.. Neurological: Alert, no facial droop, fluent speech, moves all extremities symmetrically, sensation in tact bilaterally Skin: Skin is warm and dry.  Psychiatric: Cooperative  ED Course  Procedures (including critical  care time) Labs Review Labs Reviewed - No data to display  Imaging Review No results found. I have personally reviewed and evaluated these images and lab results as part of my medical decision-making.    MDM   Final diagnoses:  Fall, initial encounter  Scalp hematoma, initial encounter  Sciatica of left side    In short, this is a 54 year old female with history of sciatica who presents after after mechanical  fall on ice. She is well-appearing in no acute distress. Vital signs are non-concerning. She is grossly neurologically intact. Tiny hematoma over the subdural skull, but no other evidence of severe injury. Clinically does not require CT imaging, does not meet any [place] CT head criteria. No evidence of cervical spine or back pain. I does have pain consistent with her sciatica. Is given analgesia here in the emergency department and able to ambulate here. No other acute injuries are suspected on exam and by history. Strict return and follow-up instructions are reviewed. She is expressed understanding of all discharge instructions and felt comfortable with the plan of care.   Lavera Guiseana Duo Brayleigh Rybacki, MD 05/14/15 1908  Lavera Guiseana Duo Zarif Rathje, MD 05/14/15 [phone]

## 2015-05-14 NOTE — Discharge Instructions (Signed)
Return without fail for worsening symptoms including worsening pain, inability to walk, confusion, vomiting unable to keep down food or fluids. You do not have serious head injury today. Please follow-up with her primary care doctor within the next 2-3 days to make sure symptoms are improving.   Head Injury, Adult You have a head injury. Headaches and throwing up (vomiting) are common after a head injury. It should be easy to wake up from sleeping. Sometimes you must stay in the hospital. Most problems happen within the first 24 hours. Side effects may occur up to 7-10 days after the injury.  WHAT ARE THE TYPES OF HEAD INJURIES? Head injuries can be as minor as a bump. Some head injuries can be more severe. More severe head injuries include:  A jarring injury to the brain (concussion).  A bruise of the brain (contusion). This mean there is bleeding in the brain that can cause swelling.  A cracked skull (skull fracture).  Bleeding in the brain that collects, clots, and forms a bump (hematoma). WHEN SHOULD I GET HELP RIGHT AWAY?   You are confused or sleepy.  You cannot be woken up.  You feel sick to your stomach (nauseous) or keep throwing up (vomiting).  Your dizziness or unsteadiness is getting worse.  You have very bad, lasting headaches that are not helped by medicine. Take medicines only as told by your doctor.  You cannot use your arms or legs like normal.  You cannot walk.  You notice changes in the black spots in the center of the colored part of your eye (pupil).  You have clear or bloody fluid coming from your nose or ears.  You have trouble seeing. During the next 24 hours after the injury, you must stay with someone who can watch you. This person should get help right away (call 911 in the [place] if you start to shake and are not able to control it (have seizures), you pass out, or you are unable to wake up. HOW CAN I PREVENT A HEAD INJURY IN THE FUTURE?  Wear seat  belts.  Wear a helmet while bike riding and playing sports like football.  Stay away from dangerous activities around the house. WHEN CAN I RETURN TO NORMAL ACTIVITIES AND ATHLETICS? See your doctor before doing these activities. You should not do normal activities or play contact sports until 1 week after the following symptoms have stopped:  Headache that does not go away.  Dizziness.  Poor attention.  Confusion.  Memory problems.  Sickness to your stomach or throwing up.  Tiredness.  Fussiness.  Bothered by bright lights or loud noises.  Anxiousness or depression.  Restless sleep. MAKE SURE YOU:   Understand these instructions.  Will watch your condition.  Will get help right away if you are not doing well or get worse.   This information is not intended to replace advice given to you by your health care provider. Make sure you discuss any questions you have with your health care provider.   Document Released: 05/03/2008 Document Revised: 06/11/2014 Document Reviewed: 01/26/2013 Elsevier Interactive Patient Education 2016 Else[employer] Inc.   Sciatica Sciatica is pain, weakness, numbness, or tingling along the path of the sciatic nerve. The nerve starts in the lower back and runs down the back of each leg. The nerve controls the muscles in the lower leg and in the back of the knee, while also providing sensation to the back of the thigh, lower leg, and the sole of  your foot. Sciatica is a symptom of another medical condition. For instance, nerve damage or certain conditions, such as a herniated disk or bone spur on the spine, pinch or put pressure on the sciatic nerve. This causes the pain, weakness, or other sensations normally associated with sciatica. Generally, sciatica only affects one side of the body. CAUSES   Herniated or slipped disc.  Degenerative disk disease.  A pain disorder involving the narrow muscle in the buttocks (piriformis syndrome).  Pelvic  injury or fracture.  Pregnancy.  Tumor (rare). SYMPTOMS  Symptoms can vary from mild to very severe. The symptoms usually travel from the low back to the buttocks and down the back of the leg. Symptoms can include:  Mild tingling or dull aches in the lower back, leg, or hip.  Numbness in the back of the calf or sole of the foot.  Burning sensations in the lower back, leg, or hip.  Sharp pains in the lower back, leg, or hip.  Leg weakness.  Severe back pain inhibiting movement. These symptoms may get worse with coughing, sneezing, laughing, or prolonged sitting or standing. Also, being overweight may worsen symptoms. DIAGNOSIS  Your caregiver will perform a physical exam to look for common symptoms of sciatica. He or she may ask you to do certain movements or activities that would trigger sciatic nerve pain. Other tests may be performed to find the cause of the sciatica. These may include:  Blood tests.  X-rays.  Imaging tests, such as an MRI or CT scan. TREATMENT  Treatment is directed at the cause of the sciatic pain. Sometimes, treatment is not necessary and the pain and discomfort goes away on its own. If treatment is needed, your caregiver may suggest:  Over-the-counter medicines to relieve pain.  Prescription medicines, such as anti-inflammatory medicine, muscle relaxants, or narcotics.  Applying heat or ice to the painful area.  Steroid injections to lessen pain, irritation, and inflammation around the nerve.  Reducing activity during periods of pain.  Exercising and stretching to strengthen your abdomen and improve flexibility of your spine. Your caregiver may suggest losing weight if the extra weight makes the back pain worse.  Physical therapy.  Surgery to eliminate what is pressing or pinching the nerve, such as a bone spur or part of a herniated disk. HOME CARE INSTRUCTIONS   Only take over-the-counter or prescription medicines for pain or discomfort as  directed by your caregiver.  Apply ice to the affected area for 20 minutes, 3-4 times a day for the first 48-72 hours. Then try heat in the same way.  Exercise, stretch, or perform your usual activities if these do not aggravate your pain.  Attend physical therapy sessions as directed by your caregiver.  Keep all follow-up appointments as directed by your caregiver.  Do not wear high heels or shoes that do not provide proper support.  Check your mattress to see if it is too soft. A firm mattress may lessen your pain and discomfort. SEEK IMMEDIATE MEDICAL CARE IF:   You lose control of your bowel or bladder (incontinence).  You have increasing weakness in the lower back, pelvis, buttocks, or legs.  You have redness or swelling of your back.  You have a burning sensation when you urinate.  You have pain that gets worse when you lie down or awakens you at night.  Your pain is worse than you have experienced in the past.  Your pain is lasting longer than 4 weeks.  You are suddenly  losing weight without reason. MAKE SURE YOU:  Understand these instructions.  Will watch your condition.  Will get help right away if you are not doing well or get worse.   This information is not intended to replace advice given to you by your health care provider. Make sure you discuss any questions you have with your health care provider.   Document Released: 05/15/2001 Document Revised: 02/09/2015 Document Reviewed: 09/30/2011 Elsevier Interactive Patient Education [employer]

## 2016-06-13 DIAGNOSIS — F17210 Nicotine dependence, cigarettes, uncomplicated: Secondary | ICD-10-CM | POA: Insufficient documentation

## 2016-11-02 HISTORY — PX: FINGER SURGERY: SHX640

## 2016-11-06 ENCOUNTER — Telehealth (HOSPITAL_COMMUNITY): Payer: Self-pay | Admitting: *Deleted

## 2016-11-06 NOTE — Telephone Encounter (Signed)
left voice message regarding an appointment. 

## 2016-12-31 ENCOUNTER — Encounter (INDEPENDENT_AMBULATORY_CARE_PROVIDER_SITE_OTHER): Payer: Self-pay

## 2016-12-31 ENCOUNTER — Encounter (HOSPITAL_COMMUNITY): Payer: Self-pay | Admitting: Psychiatry

## 2016-12-31 ENCOUNTER — Ambulatory Visit (INDEPENDENT_AMBULATORY_CARE_PROVIDER_SITE_OTHER): Payer: PRIVATE HEALTH INSURANCE | Admitting: Psychiatry

## 2016-12-31 DIAGNOSIS — F331 Major depressive disorder, recurrent, moderate: Secondary | ICD-10-CM
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
## 2017-01-15 ENCOUNTER — Encounter (HOSPITAL_COMMUNITY): Payer: Self-pay | Admitting: Psychiatry

## 2017-01-15 ENCOUNTER — Ambulatory Visit (INDEPENDENT_AMBULATORY_CARE_PROVIDER_SITE_OTHER): Payer: PRIVATE HEALTH INSURANCE | Admitting: Psychiatry

## 2017-01-15 DIAGNOSIS — F331 Major depressive disorder, recurrent, moderate: Secondary | ICD-10-CM | POA: Diagnosis not present
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
## 2017-01-29 ENCOUNTER — Ambulatory Visit (HOSPITAL_COMMUNITY): Payer: PRIVATE HEALTH INSURANCE | Admitting: Psychiatry

## 2017-02-12 ENCOUNTER — Telehealth (HOSPITAL_COMMUNITY): Payer: Self-pay | Admitting: *Deleted

## 2017-02-12 ENCOUNTER — Ambulatory Visit (HOSPITAL_COMMUNITY): Payer: PRIVATE HEALTH INSURANCE | Admitting: Psychiatry

## 2017-02-12 NOTE — Telephone Encounter (Signed)
Per front staff, phone call from patient to cancel both appointments with Peggy on 02/12/17 and with Dr. Vanetta ShawlHisada on 02/14/17.   She don't have the money

## 2017-02-12 NOTE — Telephone Encounter (Signed)
phone call from patient to cancel both appointments with Peggy on 02/12/17 and with Dr. Vanetta ShawlHisada on 02/14/17.   She don't have the money.
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
## 2017-02-13 NOTE — Telephone Encounter (Signed)
noted 

## 2017-02-14 ENCOUNTER — Ambulatory Visit (HOSPITAL_COMMUNITY): Payer: Self-pay | Admitting: Psychiatry
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
## 2017-05-01 ENCOUNTER — Telehealth (HOSPITAL_COMMUNITY): Payer: Self-pay | Admitting: *Deleted

## 2017-05-01 ENCOUNTER — Telehealth (HOSPITAL_COMMUNITY): Payer: Self-pay | Admitting: Psychiatry

## 2017-05-01 ENCOUNTER — Encounter (HOSPITAL_COMMUNITY): Payer: Self-pay | Admitting: Psychiatry

## 2017-05-01 ENCOUNTER — Ambulatory Visit (INDEPENDENT_AMBULATORY_CARE_PROVIDER_SITE_OTHER): Payer: PRIVATE HEALTH INSURANCE | Admitting: Psychiatry

## 2017-05-01 VITALS — BP 142/82 | HR 105 | Ht 61.0 in | Wt 154.0 lb

## 2017-05-01 DIAGNOSIS — R45851 Suicidal ideations: Secondary | ICD-10-CM

## 2017-05-01 DIAGNOSIS — Z818 Family history of other mental and behavioral disorders: Secondary | ICD-10-CM

## 2017-05-01 DIAGNOSIS — F17210 Nicotine dependence, cigarettes, uncomplicated: Secondary | ICD-10-CM | POA: Diagnosis not present

## 2017-05-01 DIAGNOSIS — F331 Major depressive disorder, recurrent, moderate: Secondary | ICD-10-CM | POA: Diagnosis not present

## 2017-05-01 DIAGNOSIS — F419 Anxiety disorder, unspecified: Secondary | ICD-10-CM

## 2017-05-01 DIAGNOSIS — R450 Nervousness: Secondary | ICD-10-CM | POA: Diagnosis not present

## 2017-05-01 DIAGNOSIS — Z62810 Personal history of physical and sexual abuse in childhood: Secondary | ICD-10-CM

## 2017-05-01 DIAGNOSIS — G4700 Insomnia, unspecified: Secondary | ICD-10-CM

## 2017-05-01 DIAGNOSIS — F410 Panic disorder [episodic paroxysmal anxiety] without agoraphobia: Secondary | ICD-10-CM

## 2017-05-01 DIAGNOSIS — Z91411 Personal history of adult psychological abuse: Secondary | ICD-10-CM

## 2017-05-01 DIAGNOSIS — F4310 Post-traumatic stress disorder, unspecified: Secondary | ICD-10-CM | POA: Insufficient documentation

## 2017-05-01 MED ORDER — SUVOREXANT 15 MG PO TABS: 7.5000 mg | ORAL_TABLET | Freq: Every day | ORAL | 0 refills | Status: DC

## 2017-05-01 MED ORDER — SUVOREXANT 10 MG PO TABS: 10.0000 mg | ORAL_TABLET | Freq: Every evening | ORAL | 0 refills | Status: DC | PRN

## 2017-05-01 MED ORDER — SUVOREXANT 10 MG PO TABS: 10.0000 mg | ORAL_TABLET | Freq: Every day | ORAL | 0 refills | Status: DC

## 2017-05-01 MED ORDER — SERTRALINE HCL 50 MG PO TABS: 50.0000 mg | ORAL_TABLET | Freq: Every day | ORAL | 0 refills | Status: DC

## 2017-05-01 MED ORDER — SERTRALINE HCL 50 MG PO TABS: ORAL_TABLET | ORAL | 0 refills | Status: DC

## 2017-05-01 NOTE — Telephone Encounter (Signed)
Prior authorization for Belsomra received. Submitted online with cover my meds. Awaiting response. 

## 2017-05-01 NOTE — Telephone Encounter (Signed)
Fax[facility] Rejection [facility]

## 2017-05-01 NOTE — Patient Instructions (Addendum)
1. Start sertraline 25 mg daily for one week, then 50 mg daily 2. Start Belsomra 7.5 mg at night as needed for insomnia 3. Discontinue Xanax 4. Return to clinic in one month for 30 mins 5. Try to clean the house regularly  What to do if you need to get in touch with someone regarding a psychiatric issue:  1. EMERGENCY: For psychiatric emergencies (if you are suicidal or if there are any other safety issues) call 911 and/or go to your nearest Emergency Room immediately.   2. IF YOU NEED SOMEONE TO TALK TO RIGHT NOW: Given my clinical responsibilities, I may not be able to speak with you over the phone for a prolonged period of time.  a. You may always call The National Suicide Prevention Lifeline at 1-800-273-TALK 671-274-8104(8255).  b. Your county of residence will also have local crisis services. [place] [facility] [place]: [facility] at [phone]

## 2017-05-01 NOTE — Telephone Encounter (Signed)
Belsomra order rejected.   Karie Mainlandli, could you help with this? She has tried trazodone, melatonin, Ambien with limited benefit.  Thanks.

## 2017-05-03 ENCOUNTER — Ambulatory Visit (HOSPITAL_COMMUNITY): Payer: Self-pay | Admitting: Psychiatry

## 2017-05-07 ENCOUNTER — Ambulatory Visit (HOSPITAL_COMMUNITY): Payer: PRIVATE HEALTH INSURANCE | Admitting: Psychiatry

## 2017-05-07 ENCOUNTER — Encounter (HOSPITAL_COMMUNITY): Payer: Self-pay | Admitting: Psychiatry

## 2017-05-07 DIAGNOSIS — F331 Major depressive disorder, recurrent, moderate: Secondary | ICD-10-CM | POA: Diagnosis not present
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
## 2017-05-13 ENCOUNTER — Telehealth (HOSPITAL_COMMUNITY): Payer: Self-pay | Admitting: *Deleted

## 2017-05-13 NOTE — Telephone Encounter (Signed)
Prior authorization for Belsomra received. Submitted online with cover my meds. Awaiting response. 

## 2017-05-17 ENCOUNTER — Telehealth (HOSPITAL_COMMUNITY): Payer: Self-pay | Admitting: *Deleted

## 2017-05-17 NOTE — Telephone Encounter (Addendum)
More information given on trial and failure of Ambien, Trazodone, and melatonin from MD and information was sent to insurance for prior authorization of belsomra. Approved and pharmacy notified.

## 2017-05-20 NOTE — Telephone Encounter (Signed)
Noted. Thanks.

## 2017-05-21 ENCOUNTER — Ambulatory Visit (HOSPITAL_COMMUNITY): Payer: PRIVATE HEALTH INSURANCE | Admitting: Psychiatry

## 2017-05-21 ENCOUNTER — Ambulatory Visit (HOSPITAL_COMMUNITY): Payer: Self-pay | Admitting: Psychiatry
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
## 2017-05-31 ENCOUNTER — Ambulatory Visit (INDEPENDENT_AMBULATORY_CARE_PROVIDER_SITE_OTHER): Payer: PRIVATE HEALTH INSURANCE | Admitting: Psychiatry

## 2017-05-31 ENCOUNTER — Encounter (HOSPITAL_COMMUNITY): Payer: Self-pay | Admitting: Psychiatry

## 2017-05-31 VITALS — BP 145/83 | HR 61 | Ht 61.0 in | Wt 154.0 lb

## 2017-05-31 DIAGNOSIS — F4310 Post-traumatic stress disorder, unspecified: Secondary | ICD-10-CM | POA: Diagnosis not present

## 2017-05-31 DIAGNOSIS — F1210 Cannabis abuse, uncomplicated: Secondary | ICD-10-CM | POA: Diagnosis not present

## 2017-05-31 DIAGNOSIS — G4700 Insomnia, unspecified: Secondary | ICD-10-CM | POA: Diagnosis not present

## 2017-05-31 DIAGNOSIS — F419 Anxiety disorder, unspecified: Secondary | ICD-10-CM

## 2017-05-31 DIAGNOSIS — R45851 Suicidal ideations: Secondary | ICD-10-CM

## 2017-05-31 DIAGNOSIS — F331 Major depressive disorder, recurrent, moderate: Secondary | ICD-10-CM

## 2017-05-31 DIAGNOSIS — F17210 Nicotine dependence, cigarettes, uncomplicated: Secondary | ICD-10-CM

## 2017-05-31 DIAGNOSIS — Z818 Family history of other mental and behavioral disorders: Secondary | ICD-10-CM | POA: Diagnosis not present

## 2017-05-31 DIAGNOSIS — R450 Nervousness: Secondary | ICD-10-CM

## 2017-05-31 MED ORDER — FLUOXETINE HCL 20 MG PO CAPS: 20.0000 mg | ORAL_CAPSULE | Freq: Every day | ORAL | 0 refills | Status: DC

## 2017-05-31 NOTE — Patient Instructions (Addendum)
1. Decrease sertraline 25 mg daily for one week, then discontinue  2. After your diarrhea resolves, start fluoxetine 20 mg daily  3. Discontinue Belsomra 4. Decrease Xanax 0.5 mg at night as needed for sleep 5. Return to clinic in one month for 30 mins

## 2017-06-05 ENCOUNTER — Ambulatory Visit (INDEPENDENT_AMBULATORY_CARE_PROVIDER_SITE_OTHER): Payer: PRIVATE HEALTH INSURANCE | Admitting: Psychiatry

## 2017-06-05 ENCOUNTER — Encounter (HOSPITAL_COMMUNITY): Payer: Self-pay | Admitting: Psychiatry

## 2017-06-05 DIAGNOSIS — F331 Major depressive disorder, recurrent, moderate: Secondary | ICD-10-CM

## 2017-06-05 DIAGNOSIS — F4310 Post-traumatic stress disorder, unspecified: Secondary | ICD-10-CM | POA: Diagnosis not present
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
## 2017-06-20 ENCOUNTER — Ambulatory Visit (HOSPITAL_COMMUNITY): Payer: Self-pay | Admitting: Psychiatry
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
## 2017-07-01 ENCOUNTER — Ambulatory Visit (HOSPITAL_COMMUNITY): Payer: Self-pay | Admitting: Psychiatry

## 2017-07-02 ENCOUNTER — Ambulatory Visit (INDEPENDENT_AMBULATORY_CARE_PROVIDER_SITE_OTHER): Payer: PRIVATE HEALTH INSURANCE | Admitting: Psychiatry

## 2017-07-02 ENCOUNTER — Encounter (HOSPITAL_COMMUNITY): Payer: Self-pay | Admitting: Psychiatry

## 2017-07-02 VITALS — BP 127/79 | HR 73 | Ht 61.0 in | Wt 156.0 lb

## 2017-07-02 DIAGNOSIS — F331 Major depressive disorder, recurrent, moderate: Secondary | ICD-10-CM | POA: Diagnosis not present

## 2017-07-02 DIAGNOSIS — Z818 Family history of other mental and behavioral disorders: Secondary | ICD-10-CM

## 2017-07-02 DIAGNOSIS — F4310 Post-traumatic stress disorder, unspecified: Secondary | ICD-10-CM

## 2017-07-02 DIAGNOSIS — F17210 Nicotine dependence, cigarettes, uncomplicated: Secondary | ICD-10-CM | POA: Diagnosis not present

## 2017-07-02 DIAGNOSIS — F1290 Cannabis use, unspecified, uncomplicated: Secondary | ICD-10-CM

## 2017-07-02 DIAGNOSIS — F419 Anxiety disorder, unspecified: Secondary | ICD-10-CM

## 2017-07-02 DIAGNOSIS — G4700 Insomnia, unspecified: Secondary | ICD-10-CM

## 2017-07-02 DIAGNOSIS — R450 Nervousness: Secondary | ICD-10-CM | POA: Diagnosis not present

## 2017-07-02 DIAGNOSIS — R197 Diarrhea, unspecified: Secondary | ICD-10-CM | POA: Diagnosis not present

## 2017-07-02 MED ORDER — FLUOXETINE HCL 40 MG PO CAPS: 40.0000 mg | ORAL_CAPSULE | Freq: Every day | ORAL | 0 refills | Status: DC
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
## 2017-07-02 NOTE — Patient Instructions (Addendum)
1. Increase fluoxetine 40 mg daily  2. Try to decrease Xanax 0.5 mg at night as needed for sleep if able  3. Return to clinic in two months for 30 mins

## 2017-07-04 ENCOUNTER — Ambulatory Visit (HOSPITAL_COMMUNITY): Payer: Self-pay | Admitting: Psychiatry
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
## 2017-08-30 ENCOUNTER — Encounter (HOSPITAL_COMMUNITY): Payer: Self-pay | Admitting: Psychiatry

## 2017-08-30 ENCOUNTER — Ambulatory Visit (INDEPENDENT_AMBULATORY_CARE_PROVIDER_SITE_OTHER): Payer: PRIVATE HEALTH INSURANCE | Admitting: Psychiatry

## 2017-08-30 VITALS — BP 145/78 | HR 75 | Ht 61.0 in | Wt 160.0 lb

## 2017-08-30 DIAGNOSIS — F331 Major depressive disorder, recurrent, moderate: Secondary | ICD-10-CM | POA: Diagnosis not present

## 2017-08-30 DIAGNOSIS — F4310 Post-traumatic stress disorder, unspecified: Secondary | ICD-10-CM | POA: Diagnosis not present

## 2017-08-30 DIAGNOSIS — F17210 Nicotine dependence, cigarettes, uncomplicated: Secondary | ICD-10-CM

## 2017-08-30 DIAGNOSIS — Z818 Family history of other mental and behavioral disorders: Secondary | ICD-10-CM

## 2017-08-30 DIAGNOSIS — Z79899 Other long term (current) drug therapy: Secondary | ICD-10-CM | POA: Diagnosis not present

## 2017-08-30 MED ORDER — FLUOXETINE HCL 20 MG PO CAPS: 20.0000 mg | ORAL_CAPSULE | Freq: Every day | ORAL | 0 refills | Status: DC

## 2017-08-30 NOTE — Patient Instructions (Signed)
1. Decrease fluoxetine 20 mg daily  2. Decrease Xanax 1 mg daily as needed for anxiety  3.Return to clinic in two months for 30 mins

## 2017-10-10 ENCOUNTER — Telehealth (HOSPITAL_COMMUNITY): Payer: Self-pay | Admitting: *Deleted

## 2017-10-10 NOTE — Telephone Encounter (Signed)
I advised her to decrease Xanax to one tab per day. I will not be able to do refill as she should have enough medication until the next visit.

## 2017-10-10 NOTE — Telephone Encounter (Signed)
Dr Vanetta Shawl Patient called requesting refill on Xanax. Stated that you 2 talked about you taking over care from the  PCP for this medication
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
## 2017-10-30 ENCOUNTER — Ambulatory Visit (INDEPENDENT_AMBULATORY_CARE_PROVIDER_SITE_OTHER): Payer: PRIVATE HEALTH INSURANCE | Admitting: Psychiatry

## 2017-10-30 ENCOUNTER — Encounter (HOSPITAL_COMMUNITY): Payer: Self-pay | Admitting: Psychiatry

## 2017-10-30 VITALS — BP 147/86 | HR 72 | Ht 61.0 in | Wt 165.0 lb

## 2017-10-30 DIAGNOSIS — F419 Anxiety disorder, unspecified: Secondary | ICD-10-CM

## 2017-10-30 DIAGNOSIS — R450 Nervousness: Secondary | ICD-10-CM

## 2017-10-30 DIAGNOSIS — R45851 Suicidal ideations: Secondary | ICD-10-CM

## 2017-10-30 DIAGNOSIS — F331 Major depressive disorder, recurrent, moderate: Secondary | ICD-10-CM

## 2017-10-30 DIAGNOSIS — F17210 Nicotine dependence, cigarettes, uncomplicated: Secondary | ICD-10-CM | POA: Diagnosis not present

## 2017-10-30 DIAGNOSIS — Z818 Family history of other mental and behavioral disorders: Secondary | ICD-10-CM | POA: Diagnosis not present

## 2017-10-30 DIAGNOSIS — F4310 Post-traumatic stress disorder, unspecified: Secondary | ICD-10-CM

## 2017-10-30 DIAGNOSIS — G4700 Insomnia, unspecified: Secondary | ICD-10-CM | POA: Diagnosis not present

## 2017-10-30 MED ORDER — VENLAFAXINE HCL ER 37.5 MG PO CP24: ORAL_CAPSULE | ORAL | 0 refills | Status: DC

## 2017-10-30 MED ORDER — VENLAFAXINE HCL ER 150 MG PO CP24: ORAL_CAPSULE | ORAL | 1 refills | Status: DC

## 2017-10-30 MED ORDER — ALPRAZOLAM 1 MG PO TABS: 1.0000 mg | ORAL_TABLET | Freq: Every evening | ORAL | 1 refills | Status: DC | PRN

## 2017-10-30 NOTE — Patient Instructions (Signed)
1. Discontinue fluoxetine 2. Start venlafaxine 37.5 mg daily for one week, then 75 mg daily for one week, then 150 mg daily 3. Decrease xanax 1 mg daily as needed for anxiety  4. Return to clinic in two months for 30 mins 5. PCP checked TSH; wnlper patient
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
## 2017-12-30 ENCOUNTER — Ambulatory Visit (HOSPITAL_COMMUNITY): Payer: PRIVATE HEALTH INSURANCE | Admitting: Psychiatry

## 2018-01-07 ENCOUNTER — Other Ambulatory Visit (HOSPITAL_COMMUNITY): Payer: Self-pay | Admitting: Psychiatry

## 2018-01-07 MED ORDER — VENLAFAXINE HCL ER 150 MG PO CP24: 150.0000 mg | ORAL_CAPSULE | Freq: Every day | ORAL | 0 refills | Status: DC

## 2019-05-25 ENCOUNTER — Emergency Department (HOSPITAL_BASED_OUTPATIENT_CLINIC_OR_DEPARTMENT_OTHER)
Admission: EM | Admit: 2019-05-25 | Discharge: 2019-05-25 | Disposition: A | Discharge status: Home / Self Care | Payer: BC Managed Care – PPO | Attending: Emergency Medicine | Admitting: Emergency Medicine

## 2019-05-25 ENCOUNTER — Other Ambulatory Visit: Payer: Self-pay

## 2019-05-25 ENCOUNTER — Emergency Department (HOSPITAL_BASED_OUTPATIENT_CLINIC_OR_DEPARTMENT_OTHER): Payer: BC Managed Care – PPO

## 2019-05-25 ENCOUNTER — Encounter (HOSPITAL_BASED_OUTPATIENT_CLINIC_OR_DEPARTMENT_OTHER): Payer: Self-pay | Admitting: *Deleted

## 2019-05-25 DIAGNOSIS — R600 Localized edema: Secondary | ICD-10-CM | POA: Diagnosis not present

## 2019-05-25 DIAGNOSIS — Z79899 Other long term (current) drug therapy: Secondary | ICD-10-CM | POA: Diagnosis not present

## 2019-05-25 DIAGNOSIS — R0981 Nasal congestion: Secondary | ICD-10-CM | POA: Insufficient documentation

## 2019-05-25 DIAGNOSIS — J029 Acute pharyngitis, unspecified: Secondary | ICD-10-CM | POA: Insufficient documentation

## 2019-05-25 DIAGNOSIS — Z20828 Contact with and (suspected) exposure to other viral communicable diseases: Secondary | ICD-10-CM | POA: Insufficient documentation

## 2019-05-25 DIAGNOSIS — H9203 Otalgia, bilateral: Secondary | ICD-10-CM | POA: Diagnosis not present

## 2019-05-25 DIAGNOSIS — F17210 Nicotine dependence, cigarettes, uncomplicated: Secondary | ICD-10-CM | POA: Insufficient documentation

## 2019-05-25 DIAGNOSIS — R059 Cough, unspecified: Secondary | ICD-10-CM

## 2019-05-25 DIAGNOSIS — M79661 Pain in right lower leg: Secondary | ICD-10-CM | POA: Diagnosis not present

## 2019-05-25 DIAGNOSIS — R05 Cough: Secondary | ICD-10-CM | POA: Insufficient documentation

## 2019-05-25 DIAGNOSIS — M79671 Pain in right foot: Secondary | ICD-10-CM | POA: Insufficient documentation

## 2019-05-25 MED ORDER — PREDNISONE 50 MG PO TABS: 50.0000 mg | ORAL_TABLET | Freq: Every day | ORAL | 0 refills | Status: AC

## 2019-05-25 NOTE — ED Provider Notes (Signed)
[facility] EMERGENCY DEPARTMENT Provider Note   CSN: [id number] Arrival date & time: 05/25/19  1405     History Chief Complaint  Patient presents with  . Cough    Sarah Mercer is a 58 y.o. female presenting for evaluation of cough, congestion, and right foot pain.  Patient states for the past 3 weeks, she has had a persistent cough.  She had several ED visits with her primary care doctor, initially started on the Z-Pak and then switched to doxycycline.  Her cough has improved, but she still states it is "deep."  In the past week, she has developed bilateral ear pain and nasal congestion.  She has a mild sore throat.  She has been taking Tessalon with mild improvement of her cough.  She has not taken anything else for her symptoms.  She has not been tested for Covid.  She has not received any chest x-rays.  She denies history of asthma or COPD.  She does not use an inhaler.  She denies sick contacts.  She denies contact with known COVID-19 positive person.  She denies fevers, chills, chest pain, shortness of breath, nausea, vomiting, abdominal pain, urinary symptoms, abnormal bowel movements. Additionally, patient presenting for right foot and leg pain.  Patient states she walked a lot just prior to Thanksgiving, approximately 4 weeks ago.  The next day, she had foot pain which starts at the base of her foot and extends up towards her calf.  Initially her arch was very swollen, this is since improved.  She is not taking anything for pain including Tylenol or ibuprofen.  She does have a history of plantar fasciitis, but that was many years ago and she does not remember this feels similar.  She tried a brace, but this did not help.  She has not used any orthotics.  Pain is worse when she first goes from sitting/lying to standing.  HPI     Past Medical History:  Diagnosis Date  . Depression   . Sciatica   . Thyroid disease     Patient Active Problem List   Diagnosis Date  Noted  . PTSD (post-traumatic stress disorder) 05/01/2017  . Moderate episode of recurrent major depressive disorder (HCC) 05/01/2017    Past Surgical History:  Procedure Laterality Date  . ABDOMINAL HYSTERECTOMY    . FINGER SURGERY Left 11/2016     OB History   No obstetric history on file.     Family History  Problem Relation Age of Onset  . Depression Mother   . Bipolar disorder Brother   . Anxiety disorder Other     Social History   Tobacco Use  . Smoking status: Current Every Day Smoker    Packs/day: 0.50    Years: 25.00    Pack years: 12.50    Types: Cigarettes  . Smokeless tobacco: Never Used  Substance Use Topics  . Alcohol use: Yes    Comment: Occ, once per year per patient 's report on 12/31/2016  . Drug use: Yes    Types: Marijuana    Comment: every now and then, last used about a month ago per patient's reporton 12/31/2016    Home Medications Prior to Admission medications   Medication Sig Start Date End Date Taking? Authorizing Provider  ALPRAZolam Prudy Feeler(XANAX) 1 MG tablet Take 1 mg by mouth at bedtime. 04/14/15   [provider]  ALPRAZolam Prudy Feeler(XANAX) 1 MG tablet Take 1 tablet (1 mg total) by mouth at bedtime as needed  for anxiety. 10/30/17   Neysa Hotter, MD  levothyroxine (SYNTHROID, LEVOTHROID) 88 MCG tablet Take 88 mcg by mouth daily. 04/29/15   [provider]  ondansetron (ZOFRAN) 4 MG tablet Take 1 tablet (4 mg total) by mouth every 8 (eight) hours as needed for nausea or vomiting. 05/14/15   Lavera Guise, MD  oxyCODONE-acetaminophen (PERCOCET/ROXICET) 5-325 MG tablet Take 1 tablet by mouth every 8 (eight) hours as needed for moderate pain or severe pain. 05/14/15   Lavera Guise, MD  predniSONE (DELTASONE) 50 MG tablet Take 1 tablet (50 mg total) by mouth daily for 5 days. 05/25/19 05/30/19  Meylin Stenzel, PA-C  venlafaxine XR (EFFEXOR-XR) 150 MG 24 hr capsule Take 1 capsule (150 mg total) by mouth daily with breakfast. 01/07/18 02/06/18   Neysa Hotter, MD    Allergies    Patient has no known allergies.  Review of Systems   Review of Systems  HENT: Positive for congestion and ear pain.   Respiratory: Positive for cough.   Musculoskeletal: Positive for myalgias.  All other systems reviewed and are negative.   Physical Exam Updated Vital Signs BP (!) 187/85 (BP Location: Right Arm)   Pulse 61   Temp 98 F (36.7 C) (Oral)   Resp 14   Ht 5\' 1"  (1.549 m)   Wt 72.6 kg   SpO2 100%   BMI 30.23 kg/m   Physical Exam Vitals and nursing note reviewed.  Constitutional:      General: She is not in acute distress.    Appearance: She is well-developed.     Comments: Sitting comfortably in the chair no acute distress  HENT:     Head: Normocephalic and atraumatic.     Right Ear: Ear canal and external ear normal. A middle ear effusion is present.     Left Ear: Ear canal and external ear normal. A middle ear effusion is present.     Ears:     Comments: Bilateral ear effusions without TM erythema or bulging.    Nose: Mucosal edema present.     Right Sinus: No maxillary sinus tenderness or frontal sinus tenderness.     Left Sinus: No maxillary sinus tenderness or frontal sinus tenderness.     Comments: Mucosal edema.  No rhinorrhea.  No sinus tenderness    Mouth/Throat:     Comments: OP clear without tonsillar swelling or exudate.  Uvula midline with equal palate rise. Eyes:     Conjunctiva/sclera: Conjunctivae normal.     Pupils: Pupils are equal, round, and reactive to light.  Cardiovascular:     Rate and Rhythm: Normal rate and regular rhythm.     Pulses: Normal pulses.  Pulmonary:     Effort: Pulmonary effort is normal. No respiratory distress.     Breath sounds: Normal breath sounds. No wheezing.     Comments: Speaking in full sentences.  Clear lung sounds in all fields. Abdominal:     General: There is no distension.     Palpations: Abdomen is soft. There is no mass.     Tenderness: There is no abdominal  tenderness. There is no guarding or rebound.  Musculoskeletal:        General: Tenderness present. Normal range of motion.     Cervical back: Normal range of motion and neck supple.     Comments: Tenderness palpation of the right calf and plantar foot.  Pedal pulse intact.  No erythema or swelling.  Pain extends to the posterior knee and  upper leg.  Skin:    General: Skin is warm and dry.     Capillary Refill: Capillary refill takes less than 2 seconds.  Neurological:     Mental Status: She is alert and oriented to person, place, and time.     ED Results / Procedures / Treatments   Labs (all labs ordered are listed, but only abnormal results are displayed) Labs Reviewed  NOVEL CORONAVIRUS, NAA (HOSP ORDER, SEND-OUT TO REF LAB; TAT 18-24 HRS)    EKG None  Radiology US Venous Img Lower Right (DVT Study)  Result Date: 05/25/2019 CLINICAL DATA:  Pain, edema EXAM: RIGHT LOWER EXTREMITY VENOUS DOPPLER ULTRASOUND TECHNIQUE: Gray-scale sonography with compression, as well as color and duplex ultrasound, were performed to evaluate the deep venous system from the level of the common femoral vein through the popliteal and proximal calf veins. COMPARISON:  None FINDINGS: Normal compressibility of the common femoral, superficial femoral, and popliteal veins, as well as the proximal calf veins. No filling defects to suggest DVT on grayscale or color Doppler imaging. Doppler waveforms show normal direction of venous flow, normal respiratory phasicity and response to augmentation. Visualized segments of the saphenous venous system normal in caliber and compressibility. Survey views of the contralateral common femoral vein are unremarkable. IMPRESSION: No femoropopliteal and no calf DVT in the visualized calf veins. If clinical symptoms are inconsistent or if there are persistent or worsening symptoms, further imaging (possibly involving the iliac veins) may be warranted. Electronically Signed   By: Corlis Leak M.D.   On: 05/25/2019 17:05   DG Chest Portable 1 View  Result Date: 05/25/2019 CLINICAL DATA:  58 year old female with cough, fever, right lower extremity pain. EXAM: PORTABLE CHEST 1 VIEW COMPARISON:  None. FINDINGS: Portable AP upright view at 1544 hours. Upper limits of normal to hyperinflated lungs with mild attenuation of upper lobe bronchovascular markings. Normal cardiac size and mediastinal contours. Visualized tracheal air column is within normal limits. Allowing for portable technique the lungs are clear. No acute osseous abnormality identified. IMPRESSION: Possible pulmonary hyperinflation. No acute cardiopulmonary abnormality. Electronically Signed   By: Odessa Fleming M.D.   On: 05/25/2019 15:57    Procedures Procedures (including critical care time)  Medications Ordered in ED Medications - No data to display  ED Course  I have reviewed the triage vital signs and the nursing notes.  Pertinent labs & imaging results that were available during my care of the patient were reviewed by me and considered in my medical decision making (see chart for details).    MDM Rules/Calculators/A&P                      Patient presenting for evaluation of cough, congestion, and right foot pain.  Physical exam shows patient who appears nontoxic.  Pulmonary exam is reassuring.  Sats stable on room air, no wheezing, rales, rhonchi.  Will obtain x-ray due to duration of cough, although low suspicion for bacterial infection.  Consider Covid, though patient is several weeks out and will not need to quarantine.  No chest pain or shortness of breath to indicate PE.  Ear effusions noted and nasal congestion noted, no sign of AOM or sinusitis at this time.  Likely viral in nature.  Additionally, patient presenting for evaluation of right leg pain.  While symptoms are likely due to plantar fasciitis, due to possible Covid diagnosis, consider blood clot.  As such will obtain ultrasound.  Chest x-ray  viewed interpreted by  me, no pneumonia, pneumothorax, effusion, cardiomegaly.  As such, respiratory symptoms are likely due to a viral illness.  Will test for Covid.  Discussed symptomatic treatment.  As it has been several weeks, will trial steroids.  Ultrasound negative for DVT.  As such, likely plantar fasciitis.  Steroids will also help with this pain.  Discussed nonpharmacotherapy ways to help with pain management, and follow-up with the podiatrist if it is not improving.  At this time, patient appears safe for discharge.  Return precautions given.  Patient states she understands and agrees to plan.  Sarah Mercer was evaluated in Emergency Department on 05/25/2019 for the symptoms described in the history of present illness. She was evaluated in the context of the global COVID-19 pandemic, which necessitated consideration that the patient might be at risk for infection with the SARS-CoV-2 virus that causes COVID-19. Institutional protocols and algorithms that pertain to the evaluation of patients at risk for COVID-19 are in a state of rapid change based on information released by regulatory bodies including the CDC and federal and state organizations. These policies and algorithms were followed during the patient's care in the ED.  Final Clinical Impression(s) / ED Diagnoses Final diagnoses:  Cough  Right foot pain    Rx / DC Orders ED Discharge Orders         Ordered    predniSONE (DELTASONE) 50 MG tablet  Daily     05/25/19 1733           Franchot Heidelberg, PA-C 05/25/19 1744    Fredia Sorrow, MD 05/28/19 [phone]

## 2019-05-25 NOTE — Discharge Instructions (Signed)
Take prednisone as prescribed.  Do not take other anti-inflammatory such as ibuprofen or Aleve while taking this medicine as this may cause increased stomach upset. Use Flonase as needed for nasal congestion and cough. Use Tylenol as needed for further foot pain. Use orthotics in your shoes to help support your arch and help with your foot pain. Stretch your feet gently prior to standing up to try to decrease pain. Use a frozen water bottle or other ice application to help with foot pain. Follow-up with the foot doctor listed below if your symptoms not improving. Follow up with your primary care doctor if you have continued cough and head cold symptoms. Return to the emergency room if any new, worsening, concerning symptoms.

## 2019-05-25 NOTE — ED Triage Notes (Signed)
Pt c/o cough x 1 week ,DX pneumonia , cont with cough, fever chills, also c/o let ankle pain denies injury

## 2019-05-26 LAB — NOVEL CORONAVIRUS, NAA (HOSP ORDER, SEND-OUT TO REF LAB; TAT 18-24 HRS): SARS-CoV-2, NAA: NOT DETECTED

## 2019-06-08 ENCOUNTER — Ambulatory Visit (INDEPENDENT_AMBULATORY_CARE_PROVIDER_SITE_OTHER): Payer: PRIVATE HEALTH INSURANCE

## 2019-06-08 ENCOUNTER — Ambulatory Visit (INDEPENDENT_AMBULATORY_CARE_PROVIDER_SITE_OTHER): Payer: PRIVATE HEALTH INSURANCE | Admitting: Podiatry

## 2019-06-08 ENCOUNTER — Other Ambulatory Visit: Payer: Self-pay

## 2019-06-08 DIAGNOSIS — M722 Plantar fascial fibromatosis: Secondary | ICD-10-CM

## 2019-06-08 DIAGNOSIS — M79671 Pain in right foot: Secondary | ICD-10-CM | POA: Diagnosis not present

## 2019-06-09 ENCOUNTER — Telehealth: Payer: Self-pay | Admitting: Podiatry

## 2019-06-09 NOTE — Telephone Encounter (Signed)
Pt called to state that she seen Dr.Evans on 06/08/19 and thought that some meds were gonna be sent to pharmacy [facility] in [place].just wanted to see if they were gonna be called in

## 2019-06-11 ENCOUNTER — Telehealth: Payer: Self-pay | Admitting: Podiatry

## 2019-06-11 NOTE — Progress Notes (Signed)
   Subjective: 59 y.o. female presenting today as a new patient with a chief complaint of shooting pain to the right heel that began about two months ago. She reports associated swelling. Walking and bearing weight increases the symptoms. She has not had any treatment for the complaint. Patient is here for further evaluation and treatment.   Past Medical History:  Diagnosis Date  . Depression   . Sciatica   . Thyroid disease      Objective: Physical Exam General: The patient is alert and oriented x3 in no acute distress.  Dermatology: Skin is warm, dry and supple bilateral lower extremities. Negative for open lesions or macerations bilateral.   Vascular: Dorsalis Pedis and Posterior Tibial pulses palpable bilateral.  Capillary fill time is immediate to all digits.  Neurological: Epicritic and protective threshold intact bilateral.   Musculoskeletal: Tenderness to palpation to the plantar aspect of the right heel along the plantar fascia. All other joints range of motion within normal limits bilateral. Strength 5/5 in all groups bilateral.   Radiographic exam: Normal osseous mineralization. Joint spaces preserved. No fracture/dislocation/boney destruction. No other soft tissue abnormalities or radiopaque foreign bodies.   Assessment: 1. Plantar fasciitis right  Plan of Care:  1. Patient evaluated. Xrays reviewed.   2. Injection of 0.5cc Celestone soluspan injected into the right plantar fascia  3. Rx for Medrol Dose Pack placed 4. Rx for Meloxicam ordered for patient. 5. Continue using OTC insoles.  6. Instructed patient regarding therapies and modalities at home to alleviate symptoms.  7. Return to clinic in 4 weeks.     Felecia Shelling, DPM [facility]  Dr. Felecia Shelling, DPM    [address] [place], [place] [address]                Office [phone]  Fax [phone]

## 2019-06-11 NOTE — Telephone Encounter (Signed)
Pt called stated that she was trying to see if there were meds that Dr, Logan Bores was gonna call in to her pharmacy  [facility]

## 2019-06-15 ENCOUNTER — Encounter: Payer: Self-pay | Admitting: Podiatry

## 2019-06-15 MED ORDER — METHYLPREDNISOLONE 4 MG PO TBPK: ORAL_TABLET | ORAL | 0 refills | Status: DC

## 2019-06-15 MED ORDER — MELOXICAM 15 MG PO TABS: 15.0000 mg | ORAL_TABLET | Freq: Every day | ORAL | 0 refills | Status: DC

## 2019-07-06 ENCOUNTER — Ambulatory Visit: Payer: PRIVATE HEALTH INSURANCE | Admitting: Podiatry

## 2019-07-28 ENCOUNTER — Other Ambulatory Visit: Payer: Self-pay | Admitting: Podiatry

## 2019-07-28 DIAGNOSIS — M722 Plantar fascial fibromatosis: Secondary | ICD-10-CM

## 2019-09-14 ENCOUNTER — Other Ambulatory Visit: Payer: Self-pay | Admitting: Podiatry

## 2019-10-13 ENCOUNTER — Other Ambulatory Visit: Payer: Self-pay | Admitting: Podiatry

## 2020-12-08 IMAGING — DX DG CHEST 1V PORT
1 series · 1 of 1 positions shown · non-contrast
Comparison: None.

CLINICAL DATA: 58-year-old female with cough, fever, right lower
extremity pain.

EXAM:
PORTABLE CHEST 1 VIEW

[chest ap]
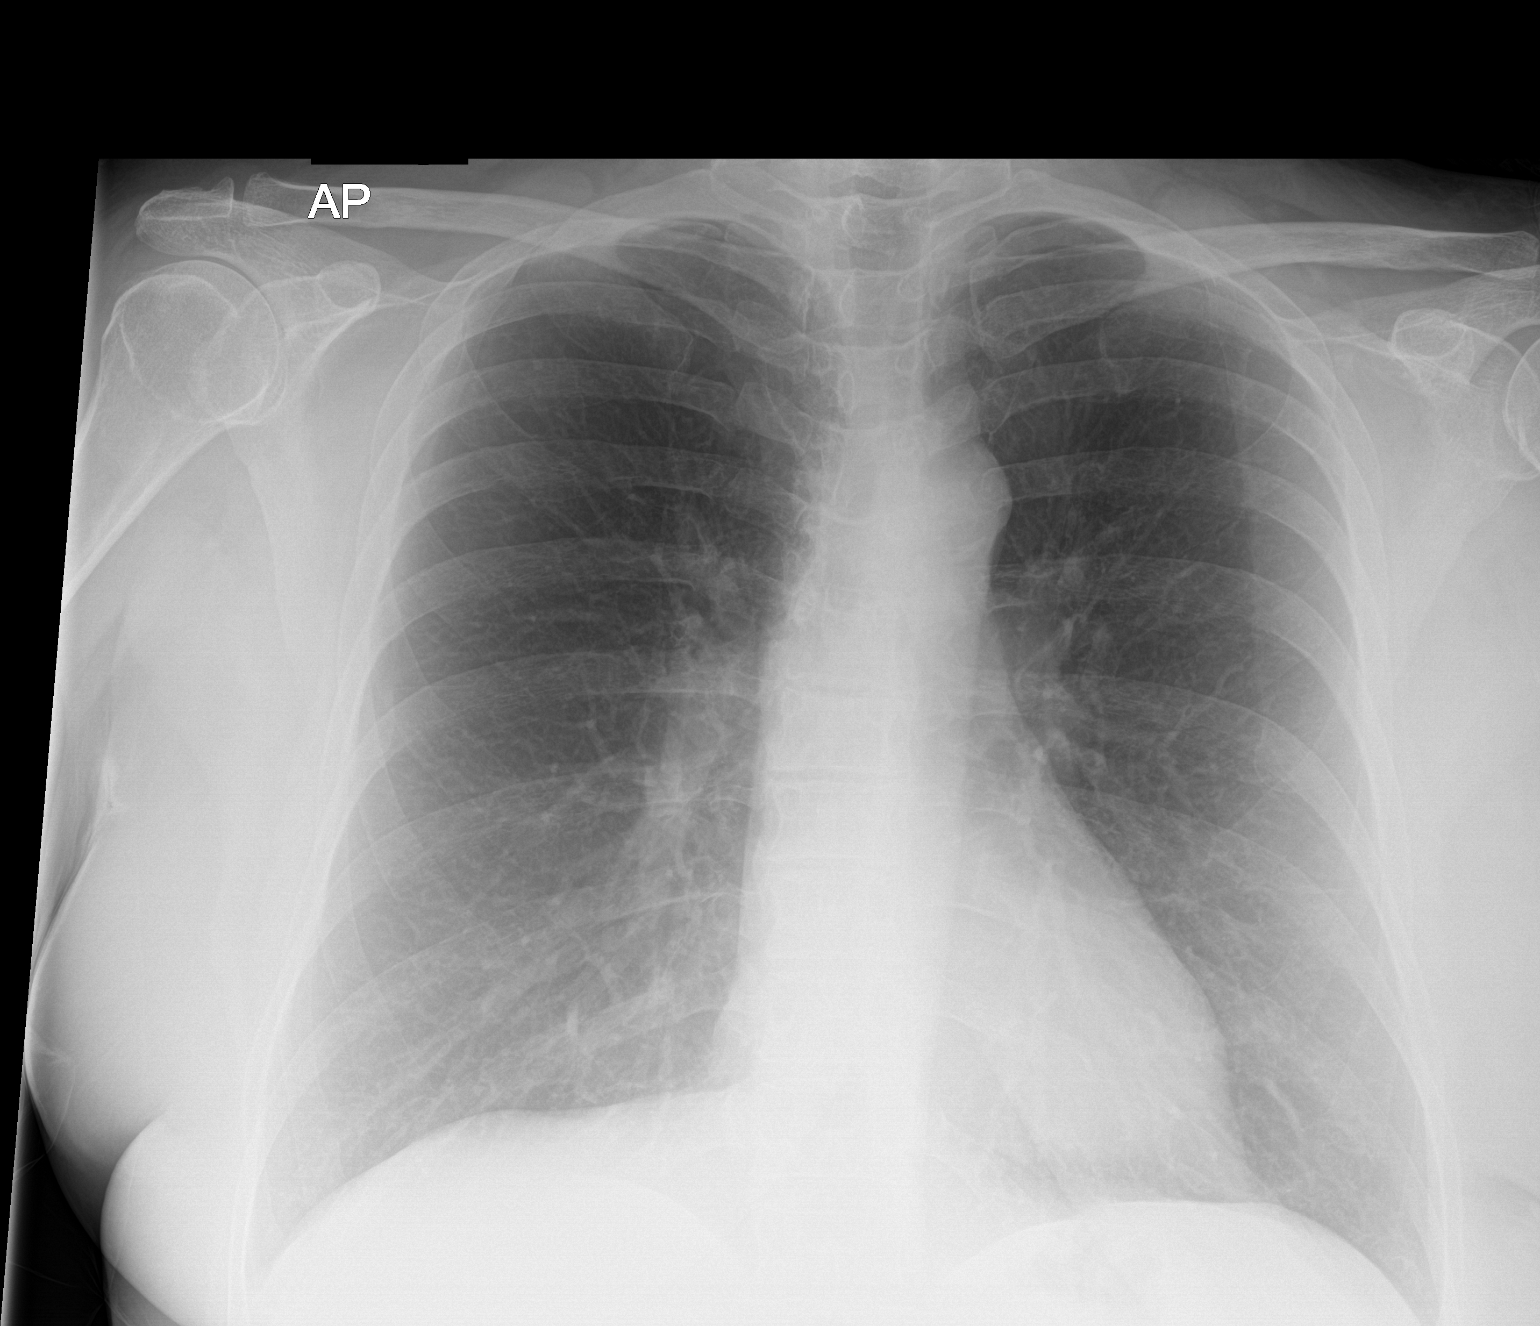

[1 of 1 positions shown; findings below may reference images not displayed]

FINDINGS: Portable AP upright view at 5733 hours. Upper limits of normal to
hyperinflated lungs with mild attenuation of upper lobe
bronchovascular markings. Normal cardiac size and mediastinal
contours. Visualized tracheal air column is within normal limits.
Allowing for portable technique the lungs are clear. No acute
osseous abnormality identified.
IMPRESSION: Possible pulmonary hyperinflation. No acute cardiopulmonary
abnormality.

## 2020-12-08 IMAGING — US US EXTREM LOW VENOUS*R*
1 series · 14 of 24 positions shown · non-contrast
Comparison: None

CLINICAL DATA: Pain, edema

EXAM:
RIGHT LOWER EXTREMITY VENOUS DOPPLER ULTRASOUND
TECHNIQUE: Gray-scale sonography with compression, as well as color and duplex
ultrasound, were performed to evaluate the deep venous system from
the level of the common femoral vein through the popliteal and
proximal calf veins.

[Series 1: us extrem low venous*right* · 14 of 30 slices shown]
[im 1/30]
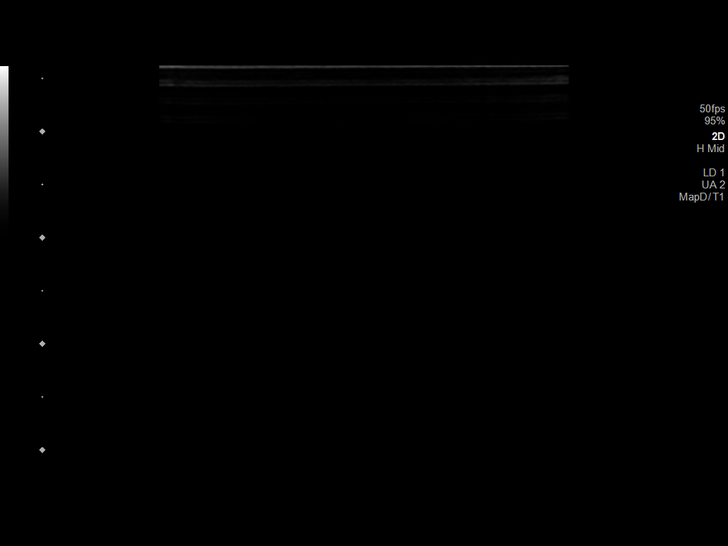
[im 3/30]
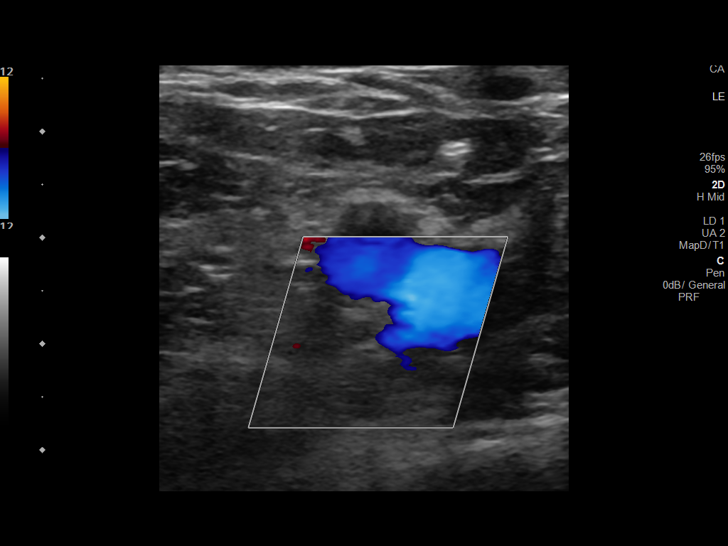
[im 6/30]
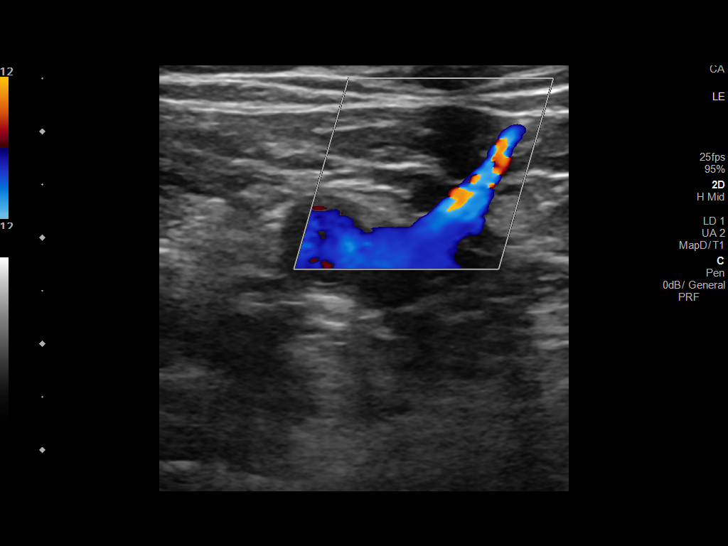
[im 8/30]
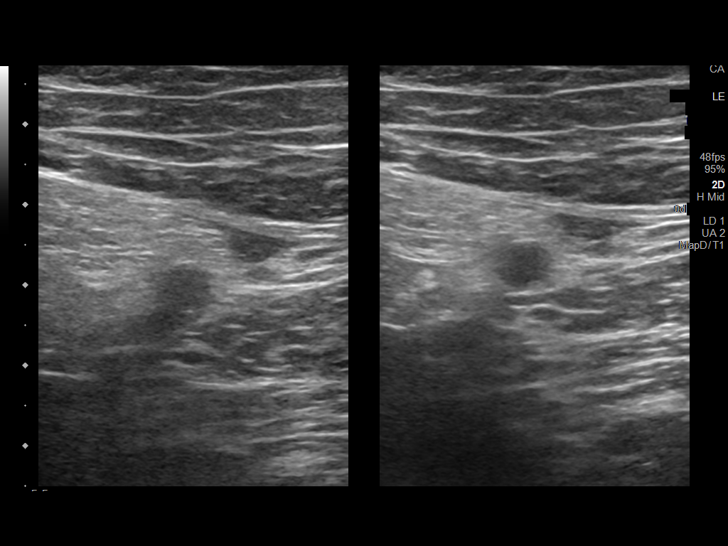
[im 9/30]
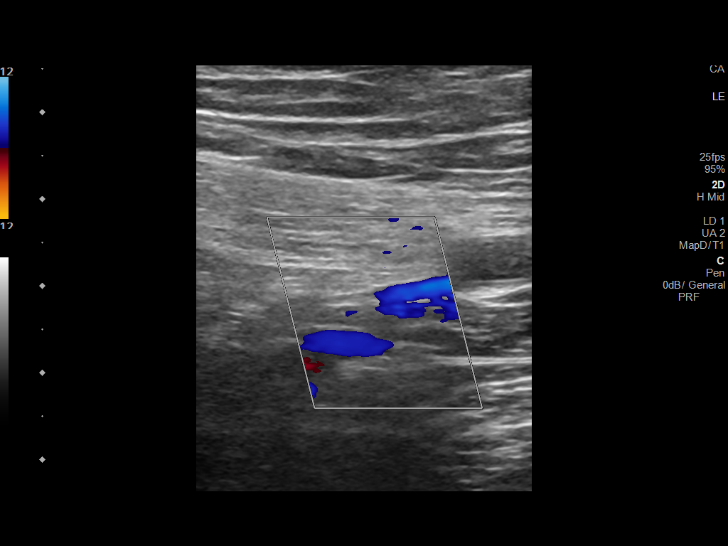
[im 12/30]
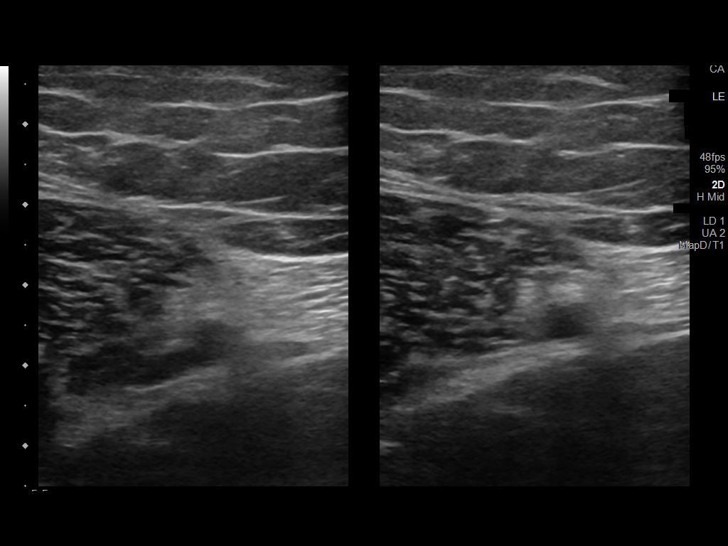
[im 14/30]
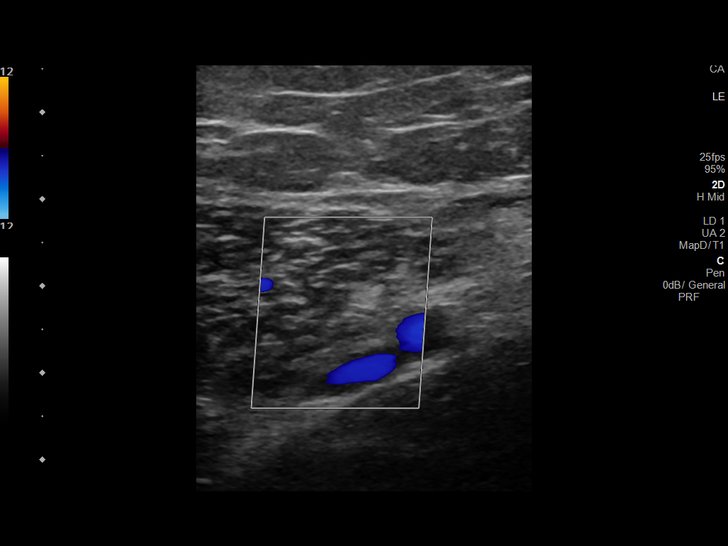
[im 16/30]
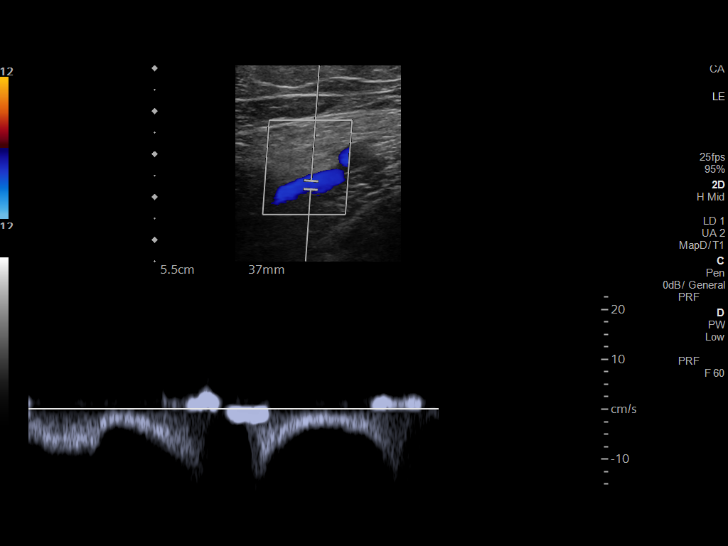
[im 18/30]
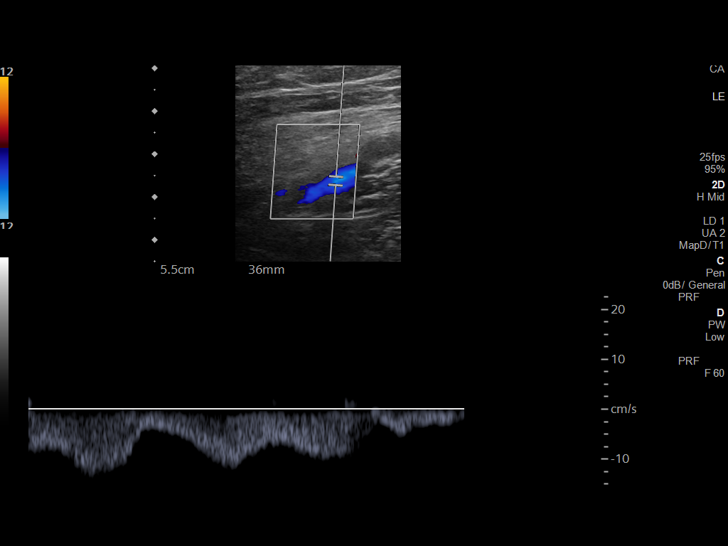
[im 21/30]
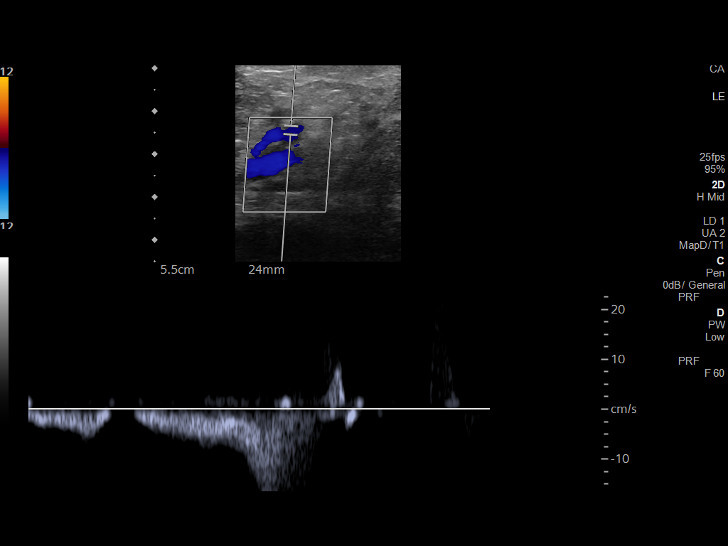
[im 23/30]
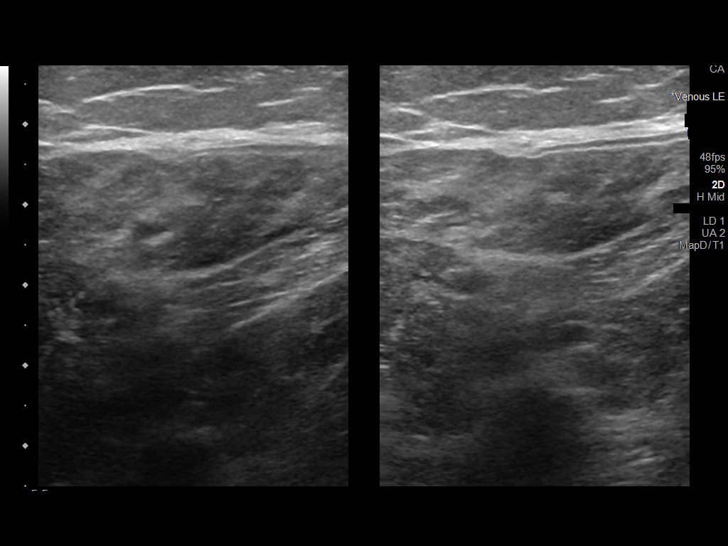
[im 24/30]
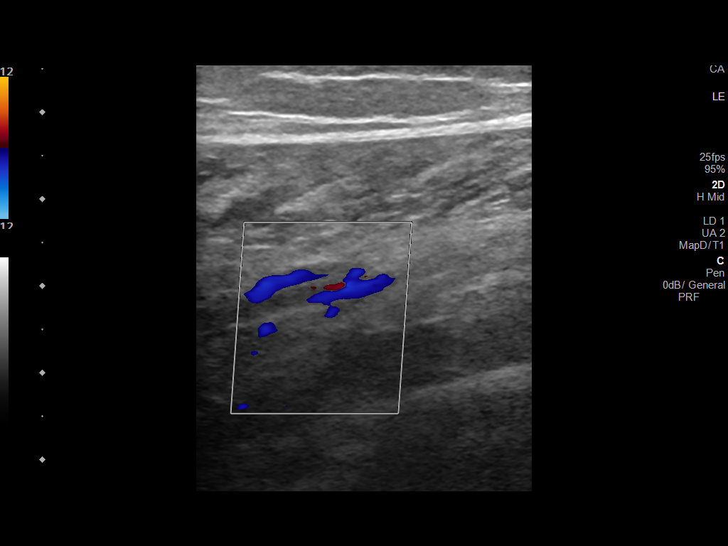
[im 27/30]
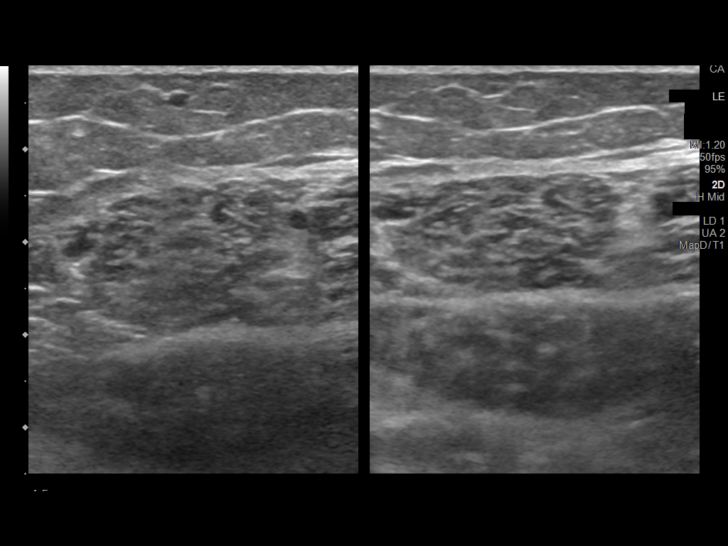
[im 30/30]
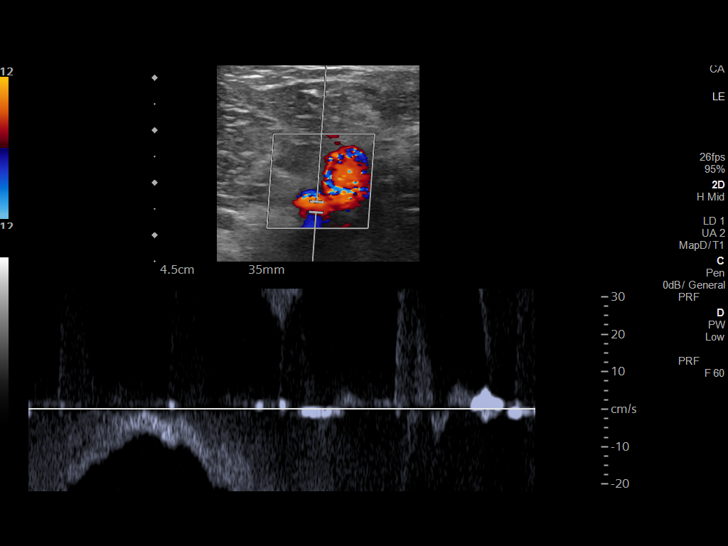

[14 of 24 positions shown; findings below may reference images not displayed]

FINDINGS: Normal compressibility of the common femoral, superficial femoral,
and popliteal veins, as well as the proximal calf veins. No filling
defects to suggest DVT on grayscale or color Doppler imaging.
Doppler waveforms show normal direction of venous flow, normal
respiratory phasicity and response to augmentation. Visualized
segments of the saphenous venous system normal in caliber and
compressibility.

Survey views of the contralateral common femoral vein are
unremarkable.
IMPRESSION: No femoropopliteal and no calf DVT in the visualized calf veins. If
clinical symptoms are inconsistent or if there are persistent or
worsening symptoms, further imaging (possibly involving the iliac
veins) may be warranted.

## 2021-08-09 ENCOUNTER — Other Ambulatory Visit: Payer: Self-pay | Admitting: Family Medicine

## 2021-08-09 ENCOUNTER — Encounter: Payer: Self-pay | Admitting: Family Medicine

## 2021-08-09 ENCOUNTER — Ambulatory Visit: Admission: RE | Admit: 2021-08-09 | Payer: BC Managed Care – PPO | Source: Ambulatory Visit

## 2021-08-09 ENCOUNTER — Ambulatory Visit (INDEPENDENT_AMBULATORY_CARE_PROVIDER_SITE_OTHER): Payer: 59 | Admitting: Family Medicine

## 2021-08-09 VITALS — BP 156/81 | HR 66 | Temp 97.3°F | Resp 20 | Ht 61.0 in | Wt 151.0 lb

## 2021-08-09 DIAGNOSIS — Z1231 Encounter for screening mammogram for malignant neoplasm of breast: Secondary | ICD-10-CM

## 2021-08-09 DIAGNOSIS — E559 Vitamin D deficiency, unspecified: Secondary | ICD-10-CM

## 2021-08-09 DIAGNOSIS — F411 Generalized anxiety disorder: Secondary | ICD-10-CM | POA: Diagnosis not present

## 2021-08-09 DIAGNOSIS — F339 Major depressive disorder, recurrent, unspecified: Secondary | ICD-10-CM

## 2021-08-09 DIAGNOSIS — I10 Essential (primary) hypertension: Secondary | ICD-10-CM

## 2021-08-09 DIAGNOSIS — Z1211 Encounter for screening for malignant neoplasm of colon: Secondary | ICD-10-CM

## 2021-08-09 DIAGNOSIS — F1320 Sedative, hypnotic or anxiolytic dependence, uncomplicated: Secondary | ICD-10-CM | POA: Insufficient documentation

## 2021-08-09 DIAGNOSIS — E039 Hypothyroidism, unspecified: Secondary | ICD-10-CM

## 2021-08-09 DIAGNOSIS — Z1212 Encounter for screening for malignant neoplasm of rectum: Secondary | ICD-10-CM

## 2021-08-09 MED ORDER — LOSARTAN POTASSIUM 50 MG PO TABS: 50.0000 mg | ORAL_TABLET | Freq: Every day | ORAL | 1 refills | Status: DC

## 2021-08-09 NOTE — Progress Notes (Addendum)
Subjective:  Patient ID: Sarah Mercer, female    DOB: 1961/05/10, 61 y.o.   MRN: [id number]  Patient Care Team: Baruch Gouty, FNP as PCP - General (Family Medicine)   Chief Complaint:  Establish Care   HPI: Sarah Mercer is a 61 y.o. female presenting on 08/09/2021 for Establish Care   Pt presents to establish care following changing insurances. She states she was being seen in [facility] with [place] and had lab work completed 3-4 months ago. She states her PMH includes HTN, vitamin D deficiency, GAD, MDD, partial hysterectomy, hypothyroidism, and sciatica pain. She is currently taking 100 mcg of Synthroid.  She is taking Wellbutrin 150 mg and Effexor 225 mg. She states her insurance is no longer going cover Effexor. She states the current combination of Effexor and Wellbutrin is not effective. She states she has previously seen Psych and is not interested in returning. She endorses feelings of worry, exhaustion, depression, and agitation. She is using her husbands prescription of Ativan for sleep and desires a refill of her prescription today.  She denies vision changes, palpitations, or lower extremity swelling. She endorses occasional headaches. She was last prescribed Xanax by her PCP in 05/2021 and was recently prescribed Atarax to help with anxiety and insomnia. Xanax was stopped.   Pt reports feeling like things would be better off is she was not alive but denies suicidal ideations or plans. Denies prior attempts or thoughts.   Depression screen PHQ 2/9 08/09/2021  Decreased Interest 3  Down, Depressed, Hopeless 3  PHQ - 2 Score 6  Altered sleeping 3  Tired, decreased energy 3  Change in appetite 3  Feeling bad or failure about yourself  3  Trouble concentrating 3  Moving slowly or fidgety/restless 2  Suicidal thoughts 2  PHQ-9 Score 25  Difficult doing work/chores Very difficult  Some encounter information is confidential and restricted. Go to Review Flowsheets activity  to see all data.   GAD 7 : Generalized Anxiety Score 08/09/2021  Nervous, Anxious, on Edge 2  Control/stop worrying 3  Worry too much - different things 3  Trouble relaxing 3  Restless 3  Easily annoyed or irritable 3  Afraid - awful might happen 3  Total GAD 7 Score 20  Anxiety Difficulty Extremely difficult     There are no diagnoses linked to this encounter.    Relevant past medical, surgical, family, and social history reviewed and updated as indicated.  Allergies and medications reviewed and updated. Data reviewed: Chart in Epic.   Past Medical History:  Diagnosis Date   Anxiety    Depression    Hypertension    Sciatica    Thyroid disease     Past Surgical History:  Procedure Laterality Date   ABDOMINAL HYSTERECTOMY     FINGER SURGERY Left 11/2016    Social History   Socioeconomic History   Marital status: Divorced    Spouse name: Not on file   Number of children: Not on file   Years of education: Not on file   Highest education level: Not on file  Occupational History   Not on file  Tobacco Use   Smoking status: Every Day    Packs/day: 0.50    Years: 20.00    Pack years: 10.00    Types: Cigarettes   Smokeless tobacco: Never  Vaping Use   Vaping Use: Never used  Substance and Sexual Activity   Alcohol use: Not Currently   Drug  use: Yes    Types: Marijuana    Comment: every now and then, last used about a month ago per patient's reporton 12/31/2016   Sexual activity: Not Currently    Birth control/protection: Surgical  Other Topics Concern   Not on file  Social History Narrative   Not on file   Social Determinants of Health   Financial Resource Strain: Not on file  Food Insecurity: Not on file  Transportation Needs: Not on file  Physical Activity: Not on file  Stress: Not on file  Social Connections: Not on file  Intimate Partner Violence: Not on file    Outpatient Encounter Medications as of 08/09/2021  Medication Sig   ALPRAZolam  (XANAX) 1 MG tablet Take 1 mg by mouth at bedtime.   buPROPion (WELLBUTRIN XL) 150 MG 24 hr tablet Take 150 mg by mouth every morning.   levothyroxine (SYNTHROID) 100 MCG tablet Take 100 mcg by mouth daily before breakfast.   losartan (COZAAR) 50 MG tablet Take 1 tablet (50 mg total) by mouth daily.   Venlafaxine HCl 225 MG TB24 Take 1 tablet by mouth daily.   Vitamin D, Ergocalciferol, (DRISDOL) 1.25 MG (50000 UNIT) CAPS capsule Take 50,000 Units by mouth once a week.   [DISCONTINUED] losartan (COZAAR) 25 MG tablet Take 25 mg by mouth every morning.   hydrOXYzine (ATARAX) 25 MG tablet Take 25-50 mg by mouth at bedtime.   [DISCONTINUED] ALPRAZolam (XANAX) 1 MG tablet Take 1 tablet (1 mg total) by mouth at bedtime as needed for anxiety.   [DISCONTINUED] levothyroxine (SYNTHROID, LEVOTHROID) 88 MCG tablet Take 88 mcg by mouth daily.   [DISCONTINUED] meloxicam (MOBIC) 15 MG tablet TAKE 1 TABLET BY MOUTH EVERY DAY (Patient not taking: Reported on 08/09/2021)   [DISCONTINUED] methylPREDNISolone (MEDROL) 4 MG TBPK tablet Take as directed.   [DISCONTINUED] ondansetron (ZOFRAN) 4 MG tablet Take 1 tablet (4 mg total) by mouth every 8 (eight) hours as needed for nausea or vomiting.   [DISCONTINUED] oxyCODONE-acetaminophen (PERCOCET/ROXICET) 5-325 MG tablet Take 1 tablet by mouth every 8 (eight) hours as needed for moderate pain or severe pain.   [DISCONTINUED] venlafaxine XR (EFFEXOR-XR) 150 MG 24 hr capsule Take 1 capsule (150 mg total) by mouth daily with breakfast.   No facility-administered encounter medications on file as of 08/09/2021.    No Known Allergies  Review of Systems  Constitutional:  Positive for activity change, appetite change and fatigue. Negative for chills, diaphoresis, fever and unexpected weight change.  Eyes:  Negative for visual disturbance.  Respiratory:  Negative for cough and shortness of breath.   Cardiovascular:  Negative for chest pain, palpitations and leg swelling.   Gastrointestinal:  Negative for abdominal pain.  Genitourinary:  Negative for decreased urine volume and difficulty urinating.  Musculoskeletal:  Positive for arthralgias.  Neurological:  Positive for headaches.  Psychiatric/Behavioral:  Positive for agitation, decreased concentration, dysphoric mood and sleep disturbance. Negative for behavioral problems, confusion, hallucinations, self-injury and suicidal ideas. The patient is nervous/anxious. The patient is not hyperactive.   All other systems reviewed and are negative.      Objective:  BP (!) 156/81    Pulse 66    Temp (!) 97.3 F (36.3 C) (Oral)    Resp 20    Ht '5\' 1"'  (1.549 m)    Wt 151 lb (68.5 kg)    SpO2 96%    BMI 28.53 kg/m    Wt Readings from Last 3 Encounters:  08/09/21 151 lb (68.5 kg)  05/25/19 160 lb (72.6 kg)  05/14/15 145 lb (65.8 kg)    Physical Exam Vitals and nursing note reviewed.  Constitutional:      Appearance: Normal appearance.  HENT:     Head: Normocephalic and atraumatic.  Eyes:     Conjunctiva/sclera: Conjunctivae normal.     Pupils: Pupils are equal, round, and reactive to light.  Cardiovascular:     Rate and Rhythm: Normal rate and regular rhythm.     Heart sounds: Normal heart sounds.  Pulmonary:     Effort: Pulmonary effort is normal.     Breath sounds: Normal breath sounds.  Abdominal:     General: Bowel sounds are normal.     Palpations: Abdomen is soft.  Musculoskeletal:     Right lower leg: No edema.     Left lower leg: No edema.  Skin:    General: Skin is warm and dry.     Capillary Refill: Capillary refill takes less than 2 seconds.  Neurological:     General: No focal deficit present.     Mental Status: She is alert and oriented to person, place, and time. Mental status is at baseline.  Psychiatric:        Attention and Perception: Attention and perception normal.        Mood and Affect: Mood is anxious.        Speech: Speech normal.        Behavior: Behavior is agitated.         Thought Content: Thought content normal. Thought content does not include homicidal or suicidal plan.        Cognition and Memory: Memory normal.    Results for orders placed or performed during the hospital encounter of 05/25/19  Novel Coronavirus, NAA (Hosp order, Send-out to Ref Lab; TAT 18-24 hrs   Specimen: Nasopharyngeal Swab; Respiratory  Result Value Ref Range   SARS-CoV-2, NAA NOT DETECTED NOT DETECTED   Coronavirus Source NASAL SWAB        Pertinent labs & imaging results that were available during my care of the patient were reviewed by me and considered in my medical decision making.  Assessment & Plan:  Sophiana was seen today for establish care.  Diagnoses and all orders for this visit:  Primary hypertension -     CBC with Differential -     CMP14+EGFR -     Lipid Panel - Occasional headaches, will increase Losartan from 25 mg to 50 mg.   Depression, recurrent ([place]) Generalized anxiety disorder -     Ambulatory referral to Psychiatry - Patient answered yes to the question "Do I wish to be dead?" Extensive conversation with patient. Patient has no thoughts of plans of suicide. Will refer to psych for management of benzo dependence, MDD, and GAD. Verbal contract for safety with pt.  Benzodiazepine dependence ([place]) -     Ambulatory referral to Psychiatry - Extensive conversation informing her that we will no be prescribing Ativan here.   Vitamin D deficiency -     Vitamin D, 25-hydroxy  Screening for colorectal cancer -     Cologuard  Encounter for screening mammogram for malignant neoplasm of breast -     MM Digital Screening; Future. Bus here today, patient to screen today.   Hypothyroidism, unspecified type -     Thyroid Panel With TSH     Continue all other maintenance medications.  Follow up plan: Return in 4 weeks (on 09/06/2021), or if symptoms worsen or  fail to improve, for HTN.   Continue healthy lifestyle choices, including diet (rich in  fruits, vegetables, and lean proteins, and low in salt and simple carbohydrates) and exercise (at least 30 minutes of moderate physical activity daily).  Educational handout given for health maintenance   The above assessment and management plan was discussed with the patient. The patient verbalized understanding of and has agreed to the management plan. Patient is aware to call the clinic if they develop any new symptoms or if symptoms persist or worsen. Patient is aware when to return to the clinic for a follow-up visit. Patient educated on when it is appropriate to go to the emergency department.   Addison Latrese Carolan, NP-S  I personally was present during the history, physical exam, and medical decision-making activities of this visit and have verified that the services and findings are accurately documented in the nurse practitioner student's note.  Monia Pouch, FNP-C [facility] [address] [place], [place] [address] [phone]

## 2021-08-10 LAB — VITAMIN D 25 HYDROXY (VIT D DEFICIENCY, FRACTURES): Vit D, 25-Hydroxy: 73.6 ng/mL (ref 30.0–100.0)

## 2021-08-10 LAB — CBC WITH DIFFERENTIAL/PLATELET
Basophils Absolute: 0 10*3/uL (ref 0.0–0.2)
Basos: 0 %
EOS (ABSOLUTE): 0.2 10*3/uL (ref 0.0–0.4)
Eos: 3 %
Hematocrit: 39.7 % (ref 34.0–46.6)
Hemoglobin: 13.7 g/dL (ref 11.1–15.9)
Immature Grans (Abs): 0 10*3/uL (ref 0.0–0.1)
Immature Granulocytes: 0 %
Lymphocytes Absolute: 2.4 10*3/uL (ref 0.7–3.1)
Lymphs: 33 %
MCH: 31.1 pg (ref 26.6–33.0)
MCHC: 34.5 g/dL (ref 31.5–35.7)
MCV: 90 fL (ref 79–97)
Monocytes Absolute: 0.4 10*3/uL (ref 0.1–0.9)
Monocytes: 6 %
Neutrophils Absolute: 4.2 10*3/uL (ref 1.4–7.0)
Neutrophils: 58 %
Platelets: 332 10*3/uL (ref 150–450)
RBC: 4.4 x10E6/uL (ref 3.77–5.28)
RDW: 12.8 % (ref 11.7–15.4)
WBC: 7.2 10*3/uL (ref 3.4–10.8)

## 2021-08-10 LAB — CMP14+EGFR
ALT: 10 IU/L (ref 0–32)
AST: 13 IU/L (ref 0–40)
Albumin/Globulin Ratio: 2.3 — ABNORMAL HIGH (ref 1.2–2.2)
Albumin: 4.5 g/dL (ref 3.8–4.8)
Alkaline Phosphatase: 111 IU/L (ref 44–121)
BUN/Creatinine Ratio: 9 — ABNORMAL LOW (ref 12–28)
BUN: 8 mg/dL (ref 8–27)
Bilirubin Total: 0.3 mg/dL (ref 0.0–1.2)
CO2: 22 mmol/L (ref 20–29)
Calcium: 9.4 mg/dL (ref 8.7–10.3)
Chloride: 104 mmol/L (ref 96–106)
Creatinine, Ser: 0.91 mg/dL (ref 0.57–1.00)
Globulin, Total: 2 g/dL (ref 1.5–4.5)
Glucose: 85 mg/dL (ref 70–99)
Potassium: 4.2 mmol/L (ref 3.5–5.2)
Sodium: 142 mmol/L (ref 134–144)
Total Protein: 6.5 g/dL (ref 6.0–8.5)
eGFR: 72 mL/min/{1.73_m2} (ref >59)

## 2021-08-10 LAB — LIPID PANEL
Chol/HDL Ratio: 3.7 ratio (ref 0.0–4.4)
Cholesterol, Total: 189 mg/dL (ref 100–199)
HDL: 51 mg/dL (ref >39)
LDL Chol Calc (NIH): 109 mg/dL — ABNORMAL HIGH (ref 0–99)
Triglycerides: 167 mg/dL — ABNORMAL HIGH (ref 0–149)
VLDL Cholesterol Cal: 29 mg/dL (ref 5–40)

## 2021-08-10 LAB — THYROID PANEL WITH TSH
Free Thyroxine Index: 2.6 (ref 1.2–4.9)
T3 Uptake Ratio: 27 % (ref 24–39)
T4, Total: 9.8 ug/dL (ref 4.5–12.0)
TSH: 0.929 u[IU]/mL (ref 0.450–4.500)

## 2021-08-24 LAB — COLOGUARD: COLOGUARD: NEGATIVE

## 2021-08-29 ENCOUNTER — Encounter: Payer: Self-pay | Admitting: Family Medicine

## 2021-09-06 ENCOUNTER — Ambulatory Visit: Payer: PRIVATE HEALTH INSURANCE | Admitting: Family Medicine

## 2022-01-01 DIAGNOSIS — R69 Illness, unspecified: Secondary | ICD-10-CM | POA: Diagnosis not present

## 2022-01-01 DIAGNOSIS — I10 Essential (primary) hypertension: Secondary | ICD-10-CM | POA: Diagnosis not present

## 2022-02-06 ENCOUNTER — Other Ambulatory Visit: Payer: Self-pay | Admitting: Family Medicine

## 2022-02-06 DIAGNOSIS — I10 Essential (primary) hypertension: Secondary | ICD-10-CM

## 2022-03-02 ENCOUNTER — Other Ambulatory Visit: Payer: Self-pay | Admitting: Family Medicine

## 2022-03-02 DIAGNOSIS — I10 Essential (primary) hypertension: Secondary | ICD-10-CM

## 2022-03-02 NOTE — Telephone Encounter (Signed)
[facility] NTBS 30 days given 02/06/22 

## 2022-03-05 ENCOUNTER — Encounter: Payer: Self-pay | Admitting: Family Medicine

## 2022-03-05 NOTE — Telephone Encounter (Signed)
LMTCB TO SCHEDULE APPT LETTER MAILED 

## 2022-03-25 DIAGNOSIS — E039 Hypothyroidism, unspecified: Secondary | ICD-10-CM | POA: Diagnosis not present

## 2022-03-25 DIAGNOSIS — E785 Hyperlipidemia, unspecified: Secondary | ICD-10-CM | POA: Diagnosis not present

## 2022-03-25 DIAGNOSIS — R69 Illness, unspecified: Secondary | ICD-10-CM | POA: Diagnosis not present

## 2022-03-25 DIAGNOSIS — I10 Essential (primary) hypertension: Secondary | ICD-10-CM | POA: Diagnosis not present

## 2022-03-25 DIAGNOSIS — Z833 Family history of diabetes mellitus: Secondary | ICD-10-CM | POA: Diagnosis not present

## 2022-03-25 DIAGNOSIS — Z720 Tobacco use: Secondary | ICD-10-CM | POA: Diagnosis not present

## 2022-03-25 DIAGNOSIS — Z809 Family history of malignant neoplasm, unspecified: Secondary | ICD-10-CM | POA: Diagnosis not present

## 2022-03-25 DIAGNOSIS — Z8249 Family history of ischemic heart disease and other diseases of the circulatory system: Secondary | ICD-10-CM | POA: Diagnosis not present

## 2022-04-04 DIAGNOSIS — R69 Illness, unspecified: Secondary | ICD-10-CM | POA: Diagnosis not present

## 2022-04-04 DIAGNOSIS — N898 Other specified noninflammatory disorders of vagina: Secondary | ICD-10-CM | POA: Diagnosis not present

## 2022-04-04 DIAGNOSIS — I10 Essential (primary) hypertension: Secondary | ICD-10-CM | POA: Diagnosis not present

## 2022-04-10 ENCOUNTER — Other Ambulatory Visit: Payer: Self-pay | Admitting: Family Medicine

## 2022-04-10 DIAGNOSIS — I10 Essential (primary) hypertension: Secondary | ICD-10-CM

## 2022-04-10 NOTE — Telephone Encounter (Signed)
[facility] NTBS 30 days given 02/06/22

## 2022-04-11 ENCOUNTER — Encounter: Payer: Self-pay | Admitting: Family Medicine

## 2022-04-11 NOTE — Telephone Encounter (Signed)
I lmtcb for pt to make an appt w/[facility] to get refills on her meds. Also, I sent pt a letter to make an appt w/[facility].

## 2022-06-06 DIAGNOSIS — L578 Other skin changes due to chronic exposure to nonionizing radiation: Secondary | ICD-10-CM | POA: Diagnosis not present

## 2022-06-06 DIAGNOSIS — L821 Other seborrheic keratosis: Secondary | ICD-10-CM | POA: Diagnosis not present

## 2022-06-06 DIAGNOSIS — B079 Viral wart, unspecified: Secondary | ICD-10-CM | POA: Diagnosis not present

## 2022-06-06 DIAGNOSIS — L814 Other melanin hyperpigmentation: Secondary | ICD-10-CM | POA: Diagnosis not present

## 2022-07-04 DIAGNOSIS — R69 Illness, unspecified: Secondary | ICD-10-CM | POA: Diagnosis not present

## 2022-07-04 DIAGNOSIS — I10 Essential (primary) hypertension: Secondary | ICD-10-CM | POA: Diagnosis not present

## 2022-07-31 ENCOUNTER — Encounter: Payer: Self-pay | Admitting: Family Medicine

## 2022-11-02 DIAGNOSIS — I10 Essential (primary) hypertension: Secondary | ICD-10-CM | POA: Diagnosis not present

## 2022-11-02 DIAGNOSIS — Z1231 Encounter for screening mammogram for malignant neoplasm of breast: Secondary | ICD-10-CM | POA: Diagnosis not present

## 2022-11-02 DIAGNOSIS — F339 Major depressive disorder, recurrent, unspecified: Secondary | ICD-10-CM | POA: Diagnosis not present

## 2022-11-20 DIAGNOSIS — I10 Essential (primary) hypertension: Secondary | ICD-10-CM | POA: Diagnosis not present

## 2023-02-18 DIAGNOSIS — Z6830 Body mass index (BMI) 30.0-30.9, adult: Secondary | ICD-10-CM | POA: Diagnosis not present

## 2023-02-18 DIAGNOSIS — I10 Essential (primary) hypertension: Secondary | ICD-10-CM | POA: Diagnosis not present

## 2023-02-18 DIAGNOSIS — M542 Cervicalgia: Secondary | ICD-10-CM | POA: Diagnosis not present

## 2023-02-18 DIAGNOSIS — R051 Acute cough: Secondary | ICD-10-CM | POA: Diagnosis not present

## 2023-05-17 DIAGNOSIS — E782 Mixed hyperlipidemia: Secondary | ICD-10-CM | POA: Diagnosis not present

## 2023-05-20 ENCOUNTER — Other Ambulatory Visit: Payer: Self-pay | Admitting: Family Medicine

## 2023-05-20 DIAGNOSIS — Z1231 Encounter for screening mammogram for malignant neoplasm of breast: Secondary | ICD-10-CM
